# Patient Record
Sex: Male | Born: 1960 | Race: White | Hispanic: No | Marital: Single | Smoking: Former smoker
Health system: Southern US, Community
[De-identification: ages and names within clinical notes are randomized; demographics above are authoritative.]

## PROBLEM LIST (undated history)

## (undated) DIAGNOSIS — J189 Pneumonia, unspecified organism: Secondary | ICD-10-CM

## (undated) DIAGNOSIS — F1010 Alcohol abuse, uncomplicated: Secondary | ICD-10-CM

## (undated) DIAGNOSIS — M1990 Unspecified osteoarthritis, unspecified site: Secondary | ICD-10-CM

## (undated) DIAGNOSIS — K7460 Unspecified cirrhosis of liver: Secondary | ICD-10-CM

## (undated) HISTORY — DX: Unspecified cirrhosis of liver: K74.60

## (undated) HISTORY — PX: APPENDECTOMY: SHX54

---

## 2003-10-14 ENCOUNTER — Ambulatory Visit (HOSPITAL_COMMUNITY): Admission: RE | Admit: 2003-10-14 | Discharge: 2003-10-14 | Payer: Self-pay | Admitting: Family Medicine

## 2004-01-14 ENCOUNTER — Emergency Department (HOSPITAL_COMMUNITY): Admission: EM | Admit: 2004-01-14 | Discharge: 2004-01-14 | Payer: Self-pay | Admitting: Emergency Medicine

## 2006-03-02 ENCOUNTER — Ambulatory Visit (HOSPITAL_COMMUNITY): Admission: RE | Admit: 2006-03-02 | Discharge: 2006-03-02 | Payer: Self-pay | Admitting: Family Medicine

## 2011-02-09 ENCOUNTER — Emergency Department (HOSPITAL_COMMUNITY)
Admission: EM | Admit: 2011-02-09 | Discharge: 2011-02-10 | Disposition: A | Discharge status: Home / Self Care | Payer: Self-pay | Attending: Emergency Medicine | Admitting: Emergency Medicine

## 2011-02-09 ENCOUNTER — Emergency Department (HOSPITAL_COMMUNITY): Payer: Self-pay

## 2011-02-09 ENCOUNTER — Encounter: Payer: Self-pay | Admitting: Emergency Medicine

## 2011-02-09 DIAGNOSIS — W230XXA Caught, crushed, jammed, or pinched between moving objects, initial encounter: Secondary | ICD-10-CM | POA: Insufficient documentation

## 2011-02-09 DIAGNOSIS — M79609 Pain in unspecified limb: Secondary | ICD-10-CM | POA: Insufficient documentation

## 2011-02-09 DIAGNOSIS — T148XXA Other injury of unspecified body region, initial encounter: Secondary | ICD-10-CM

## 2011-02-09 DIAGNOSIS — M7989 Other specified soft tissue disorders: Secondary | ICD-10-CM | POA: Insufficient documentation

## 2011-02-09 DIAGNOSIS — R609 Edema, unspecified: Secondary | ICD-10-CM | POA: Insufficient documentation

## 2011-02-09 DIAGNOSIS — S8010XA Contusion of unspecified lower leg, initial encounter: Secondary | ICD-10-CM | POA: Insufficient documentation

## 2011-02-09 DIAGNOSIS — S8012XA Contusion of left lower leg, initial encounter: Secondary | ICD-10-CM

## 2011-02-09 MED ORDER — OXYCODONE-ACETAMINOPHEN 5-325 MG PO TABS
2.0000 | ORAL_TABLET | Freq: Once | ORAL | Status: DC
  Filled 2011-02-09: qty 2

## 2011-02-09 NOTE — ED Notes (Signed)
Pt. Refused medication. States it does not work for him.

## 2011-02-09 NOTE — ED Notes (Signed)
Patient was chopping wood and a piece of wood rolled and pinned him to trailer.  Left leg swollen and edematous.

## 2011-02-10 ENCOUNTER — Inpatient Hospital Stay (HOSPITAL_COMMUNITY): Admit: 2011-02-10 | Payer: Self-pay

## 2011-02-10 LAB — BASIC METABOLIC PANEL
BUN: 10 mg/dL (ref 6–23)
CO2: 24 mEq/L (ref 19–32)
Calcium: 9.2 mg/dL (ref 8.4–10.5)
Chloride: 99 mEq/L (ref 96–112)
Creatinine, Ser: 1.03 mg/dL (ref 0.50–1.35)
GFR calc Af Amer: >90 mL/min (ref >90)
GFR calc non Af Amer: 82 mL/min — ABNORMAL LOW (ref >90)
Glucose, Bld: 97 mg/dL (ref 70–99)
Potassium: 3.8 mEq/L (ref 3.5–5.1)
Sodium: 137 mEq/L (ref 135–145)

## 2011-02-10 LAB — CBC
HCT: 45.3 % (ref 39.0–52.0)
Hemoglobin: 15.5 g/dL (ref 13.0–17.0)
MCH: 32.8 pg (ref 26.0–34.0)
MCHC: 34.2 g/dL (ref 30.0–36.0)
MCV: 95.8 fL (ref 78.0–100.0)
Platelets: 256 10*3/uL (ref 150–400)
RBC: 4.73 MIL/uL (ref 4.22–5.81)
RDW: 13.4 % (ref 11.5–15.5)
WBC: 5.8 10*3/uL (ref 4.0–10.5)

## 2011-02-10 LAB — ETHANOL: Alcohol, Ethyl (B): 255 mg/dL — ABNORMAL HIGH (ref 0–11)

## 2011-02-10 NOTE — ED Provider Notes (Signed)
History     CSN: [id number]  Arrival date & time 02/09/11  2234   First MD Initiated Contact with Patient 02/09/11 2308      Chief Complaint  Patient presents with  . Leg Injury    (Consider location/radiation/quality/duration/timing/severity/associated sxs/prior treatment) HPI The patient presents with a left leg injury that occurred approximately one week ago.  He reports his left leg was crushed between the ground and a large log.  He's been walking on his leg since then and presents today complaining of ongoing pain in his left lower leg.  He reports the bruising is improving however he does have a large swelling in his left proximal medial aspect of his tibia.  He denies fever or chills.  He denies spreading erythema or warmth.  He denies swelling of his left thigh as compared to his right thigh.  He does report that there is swelling in his left lower extremity.  Pain is worsened by movement and palpation.  Is improved by nothing.  He has not been elevating his leg nor applying ice.  History reviewed. No pertinent past medical history.  History reviewed. No pertinent past surgical history.  No family history on file.  History  Substance Use Topics  . Smoking status: Former Games developer  . Smokeless tobacco: Not on file  . Alcohol Use: Yes     daily      Review of Systems  All other systems reviewed and are negative.    Allergies  Review of patient's allergies indicates no known allergies.  Home Medications  No current outpatient prescriptions on file.  BP 146/87  Pulse 99  Temp(Src) 97.7 F (36.5 C) (Oral)  Resp 20  Ht 5\' 8"  (1.727 m)  Wt 240 lb (108.863 kg)  BMI 36.49 kg/m2  SpO2 90%  Physical Exam  Constitutional: He is oriented to person, place, and time. He appears well-developed and well-nourished.       Smells of EtOH  HENT:  Head: Normocephalic.  Eyes: EOM are normal.  Neck: Normal range of motion.  Pulmonary/Chest: Effort normal.    Musculoskeletal:       Left lower extremity with mild edema and dependent ecchymosis in his left posterior calf.  He has a normal left DP and PT pulse.  His normal range of motion in his left ankle.  He has normal range of motion about his left knee.  There is no warmth of his left leg.  His compartments are soft to palpation.  His thighs are symmetric bilaterally.  There does appear to be a large hematoma of his left proximal medial aspect of his lower leg.  With tenderness and no fluctuance  Neurological: He is alert and oriented to person, place, and time.  Psychiatric: He has a normal mood and affect.    ED Course  Procedures (including critical care time)  Labs Reviewed  BASIC METABOLIC PANEL - Abnormal; Notable for the following:    GFR calc non Af Amer 82 (*)    All other components within normal limits  ETHANOL - Abnormal; Notable for the following:    Alcohol, Ethyl (B) 255 (*)    All other components within normal limits  CBC   Dg Tibia/fibula Left  02/10/2011  *RADIOLOGY REPORT*  Clinical Data: Left mid tibia / fibula pain and swelling, after blunt trauma.  LEFT TIBIA AND FIBULA - 2 VIEW  Comparison: None.  Findings: There is no evidence of fracture or dislocation.  The tibia and fibula  appear intact.  A well-corticated osseous fragment arising at the posterior malleolus may reflect remote injury.  Mild degenerative change is noted about the ankle mortise.  A plantar calcaneal spur is incidentally seen.  No significant soft tissue abnormalities are characterized on radiograph.  IMPRESSION:  1.  No evidence of fracture or dislocation. 2.  Well-corticated osseous fragment at the posterior malleolus may reflect remote injury.  Original Report Authenticated By: Tonia Ghent, M.D.   I personally evaluated the x-ray.   1. Contusion of left lower leg   2. Hematoma       MDM  The patient's compartments are soft.  His plain films are negative.  My suspicion that there is infection  is very low.  I suspect this is a large hematoma that is causing ongoing pain.  The patient is continued to be extremely active on his left lower extremity and working daily and I suspect that is exacerbating his symptoms.  The patient has no left thigh swelling to suggest DVT however given his trauma and a swelling in his left lower extremity was reasonable to obtain.  There is no ability to night to obtain an ultrasound also will schedule the patient for an ultrasound in the morning.  Typically I did give the patient Lovenox however given my suspicion that this is hematoma more to avoid Lovenox at this point unless there is clear-cut evidence of DVT on [place] in the AM.  He denies chest pain shortness of breath at this time.  His pulse ox is 99% on room air he does not have the PE clinically.  The patient does have a primary care physician for a mild recommended followup.  I recommended ice and elevation and close followup.  His compartments are soft and there is no signs of compartment syndrome at this time.  He has pulses and normal temperature of the skin.  His note pain with passive range of motion.  He has no paresthesias  Pt was offered percocet for pain. He reports he doesn't want it      Lyanne Co, MD 02/10/11 [phone]

## 2015-09-11 ENCOUNTER — Encounter (HOSPITAL_COMMUNITY): Payer: Self-pay

## 2015-09-11 ENCOUNTER — Emergency Department (HOSPITAL_COMMUNITY)
Admission: EM | Admit: 2015-09-11 | Discharge: 2015-09-12 | Disposition: A | Discharge status: Home / Self Care | Payer: BLUE CROSS/BLUE SHIELD | Attending: Emergency Medicine | Admitting: Emergency Medicine

## 2015-09-11 DIAGNOSIS — M62838 Other muscle spasm: Secondary | ICD-10-CM

## 2015-09-11 DIAGNOSIS — F0781 Postconcussional syndrome: Secondary | ICD-10-CM

## 2015-09-11 DIAGNOSIS — Z87891 Personal history of nicotine dependence: Secondary | ICD-10-CM | POA: Insufficient documentation

## 2015-09-11 DIAGNOSIS — R51 Headache: Secondary | ICD-10-CM | POA: Diagnosis present

## 2015-09-11 HISTORY — DX: Alcohol abuse, uncomplicated: F10.10

## 2015-09-11 NOTE — ED Triage Notes (Signed)
C/o left sided head , neck and left shoulder pain. Patient states pain X1 week. Denies recent injury. Patient also states he has been having dizzy/syncopal episodes with head movement.

## 2015-09-11 NOTE — ED Notes (Signed)
Patient states he drinks alcohol daily.

## 2015-09-12 MED ORDER — IBUPROFEN 800 MG PO TABS: 800.0000 mg | ORAL_TABLET | Freq: Three times a day (TID) | ORAL | 0 refills | Status: DC | PRN

## 2015-09-12 MED ORDER — KETOROLAC TROMETHAMINE 30 MG/ML IJ SOLN
60.0000 mg | Freq: Once | INTRAMUSCULAR | Status: AC
  Administered 2015-09-12: 60 mg via INTRAMUSCULAR
  Filled 2015-09-12: qty 2

## 2015-09-12 MED ORDER — HYDROMORPHONE HCL 1 MG/ML IJ SOLN
1.0000 mg | Freq: Once | INTRAMUSCULAR | Status: AC
  Administered 2015-09-12: 1 mg via INTRAMUSCULAR
  Filled 2015-09-12: qty 1

## 2015-09-12 MED ORDER — METHOCARBAMOL 500 MG PO TABS: 500.0000 mg | ORAL_TABLET | Freq: Three times a day (TID) | ORAL | 0 refills | Status: DC | PRN

## 2015-09-12 MED ORDER — HYDROCODONE-ACETAMINOPHEN 5-325 MG PO TABS: 1.0000 | ORAL_TABLET | Freq: Four times a day (QID) | ORAL | 0 refills | Status: DC | PRN

## 2015-09-12 NOTE — ED Provider Notes (Signed)
TIME SEEN: 12:14 AM  CHIEF COMPLAINT: Left Shoulder Pain  HPI: HPI Comments: Billy Chung is a 55 y.o. male who presents to the Emergency Department complaining of intermittent, worsening, severe, cramping, left shoulder pain onset one week. Pt notes pain radiation into his left trapezius, left neck, and the left side of his head. He endorses pain exacerbation with palpation to these areas, most significantly his left trapezius muscle. Pt states he lifts heavy objects daily but denies known injury to his left arm, neck or head. Pt endorses some pain alleviation with "popping out" his left arm. He has tried various OTC pain medications with minimal relief. Pt notes some intermittent SOB at baseline denies any new chest pain, shortness of breath. He also reports intermittent dizziness since a concussion several years ago; he denies new head injury. Pt has no known allergies to medications. He denies chest pain, nausea, vomiting, numbness or focal weakness, bowel/bladder incontinence, dysuria, or any other associated symptoms.  PCP: Dr. Sudie Bailey   ROS: See HPI Constitutional: no fever  Eyes: no drainage  ENT: no runny nose   Cardiovascular:  no chest pain  Resp: SOB (intermittent, baseline) GI: no vomiting GU: no dysuria Integumentary: no rash  Allergy: no hives  Musculoskeletal: no leg swelling  Neurological: no slurred speech ROS otherwise negative  PAST MEDICAL HISTORY/PAST SURGICAL HISTORY:  Past Medical History:  Diagnosis Date  . Alcohol abuse     MEDICATIONS:  Prior to Admission medications   Not on File    ALLERGIES:  No Known Allergies  SOCIAL HISTORY:  Social History  Substance Use Topics  . Smoking status: Former Games developer  . Smokeless tobacco: Current User    Types: Chew  . Alcohol use Yes     Comment: daily    FAMILY HISTORY: History reviewed. No pertinent family history.  EXAM: BP 141/81 (BP Location: Right Arm)   Pulse 91   Temp 98 F (36.7 C)  (Oral)   Resp 20   Ht 5\' 8"  (1.727 m)   Wt 280 lb (127 kg)   SpO2 92%   BMI 42.57 kg/m  CONSTITUTIONAL: Alert and oriented and responds appropriately to questions. Well-appearing; well-nourished, obese, in no distress, laughing HEAD: Normocephalic EYES: Conjunctivae clear, PERRL ENT: normal nose; no rhinorrhea; moist mucous membranes NECK: Supple, no meningismus, no LAD, no midline spinal tenderness, step off, or deformity; TTP over the left trapezius muscle which reproduces pain and associated muscle spasm CARD: RRR; S1 and S2 appreciated; no murmurs, no clicks, no rubs, no gallops RESP: Normal chest excursion without splinting or tachypnea; breath sounds clear and equal bilaterally; no wheezes, no rhonchi, no rales, no hypoxia or respiratory distress, speaking full sentences ABD/GI: Normal bowel sounds; non-distended; soft, non-tender, no rebound, no guarding, no peritoneal signs BACK:  The back appears normal and is non-tender to palpation, there is no CVA tenderness EXT: No bony tenderness of left shoulder; no loss of fullness of left shoulder; 2+ radial pulse left arm; compartments are soft; no joint effusion; normal ROM in all joints; non-tender to palpation; no edema; normal capillary refill; no cyanosis, no calf tenderness or swelling    SKIN: Normal color for age and race; warm; no rash NEURO: Moves all extremities equally, sensation to light touch intact diffusely, cranial nerves II through XII intact; 5/5 strength in all four extremities, normal gait PSYCH: The patient's mood and manner are appropriate. Grooming and personal hygiene are appropriate.  MEDICAL DECISION MAKING: Patient here with left trapezius muscle  spasm. Has no midline spinal tenderness and no bony tenderness of the left shoulder on exam. No history of injury to suggest he needs x-rays to rule out fracture. He is neurologically intact distally and has strong pulses in his left arm. No sign of cellulitis, gout, septic  arthritis. Doubt meningitis. Discussed with patient I suspect muscle spasm, less likely rotator cuff tendinitis. We'll discharge with prescriptions for Vicodin, Robaxin, ibuprofen. We'll have him follow-up with his PCP.  He does mention having a head injury several years ago and has had intermittent dizziness since. No new neurologic deficits. No new head injury. Not on anticoagulation. He does discuss that he frequently has to climb ladders. Discussed with patient I do not feel this is safe as he is having dizziness. Have offered him a work note which he declines. Have recommended he follow-up with a neurologist. Have given him outpatient follow-up information. Patient and wife at bedside agree with this plan.   At this time, I do not feel there is any life-threatening condition present. I have reviewed and discussed all results (EKG, imaging, lab, urine as appropriate), exam findings with patient. I have reviewed nursing notes and appropriate previous records.  I feel the patient is safe to be discharged home without further emergent workup. Discussed usual and customary return precautions. Patient and family (if present) verbalize understanding and are comfortable with this plan.  Patient will follow-up with their primary care provider. If they do not have a primary care provider, information for follow-up has been provided to them. All questions have been answered.      EKG Interpretation  Date/Time:  Saturday September 12 2015 00:04:20 EDT Ventricular Rate:  90 PR Interval:    QRS Duration: 106 QT Interval:  352 QTC Calculation: 431 R Axis:   70 Text Interpretation:  Sinus rhythm Nonspecific T abnormalities, lateral leads limb leads no longer reversed Confirmed by Carletha Dawn,  DO, Suzane Vanderweide ([id number]) on 09/12/2015 12:08:48 AM       I personally performed the services described in this documentation, which was scribed in my presence. The recorded information has been reviewed and is accurate.     Layla Maw Angelgabriel Willmore, DO 09/12/15 [phone]

## 2015-11-01 ENCOUNTER — Encounter (HOSPITAL_COMMUNITY): Payer: Self-pay | Admitting: Emergency Medicine

## 2015-11-01 ENCOUNTER — Emergency Department (HOSPITAL_COMMUNITY): Payer: BLUE CROSS/BLUE SHIELD

## 2015-11-01 ENCOUNTER — Emergency Department (HOSPITAL_COMMUNITY)
Admission: EM | Admit: 2015-11-01 | Discharge: 2015-11-01 | Disposition: A | Discharge status: Home / Self Care | Payer: BLUE CROSS/BLUE SHIELD | Attending: Emergency Medicine | Admitting: Emergency Medicine

## 2015-11-01 DIAGNOSIS — S82851A Displaced trimalleolar fracture of right lower leg, initial encounter for closed fracture: Secondary | ICD-10-CM | POA: Diagnosis not present

## 2015-11-01 DIAGNOSIS — Y9389 Activity, other specified: Secondary | ICD-10-CM | POA: Insufficient documentation

## 2015-11-01 DIAGNOSIS — Y999 Unspecified external cause status: Secondary | ICD-10-CM | POA: Diagnosis not present

## 2015-11-01 DIAGNOSIS — Z87891 Personal history of nicotine dependence: Secondary | ICD-10-CM | POA: Insufficient documentation

## 2015-11-01 DIAGNOSIS — S99911A Unspecified injury of right ankle, initial encounter: Secondary | ICD-10-CM | POA: Diagnosis present

## 2015-11-01 DIAGNOSIS — W231XXA Caught, crushed, jammed, or pinched between stationary objects, initial encounter: Secondary | ICD-10-CM | POA: Diagnosis not present

## 2015-11-01 DIAGNOSIS — Y929 Unspecified place or not applicable: Secondary | ICD-10-CM | POA: Insufficient documentation

## 2015-11-01 DIAGNOSIS — Z791 Long term (current) use of non-steroidal anti-inflammatories (NSAID): Secondary | ICD-10-CM | POA: Diagnosis not present

## 2015-11-01 DIAGNOSIS — Z79899 Other long term (current) drug therapy: Secondary | ICD-10-CM | POA: Diagnosis not present

## 2015-11-01 MED ORDER — OXYCODONE-ACETAMINOPHEN 5-325 MG PO TABS: 1.0000 | ORAL_TABLET | Freq: Four times a day (QID) | ORAL | 0 refills | Status: DC | PRN

## 2015-11-01 MED ORDER — BACITRACIN ZINC 500 UNIT/GM EX OINT
TOPICAL_OINTMENT | CUTANEOUS | Status: AC
  Administered 2015-11-01: 22:00:00
  Filled 2015-11-01: qty 0.9

## 2015-11-01 MED ORDER — OXYCODONE-ACETAMINOPHEN 5-325 MG PO TABS: 1.0000 | ORAL_TABLET | Freq: Four times a day (QID) | ORAL | Status: DC | PRN

## 2015-11-01 MED ORDER — PROPOFOL 10 MG/ML IV BOLUS
INTRAVENOUS | Status: AC | PRN
  Administered 2015-11-01 (×4): 20 mg via INTRAVENOUS
  Administered 2015-11-01: 40 mg via INTRAVENOUS
  Administered 2015-11-01 (×6): 20 mg via INTRAVENOUS

## 2015-11-01 MED ORDER — PROPOFOL 10 MG/ML IV BOLUS
0.5000 mg/kg | Freq: Once | INTRAVENOUS | Status: DC
  Filled 2015-11-01: qty 20

## 2015-11-01 NOTE — ED Provider Notes (Signed)
[facility]-EMERGENCY DEPT Provider Note   CSN: [id number] Arrival date & time: 11/01/15  1900     History   Chief Complaint Chief Complaint  Patient presents with  . Leg Injury    HPI Billy Chung is a 55 y.o. male.  Patient is a 55 year old male with no significant past medical history. He presents for evaluation of a right ankle injury. He reports accidentally lowering the tractor onto his leg. His friends were able to lift it and get his ankle out from under it. He has deformity, pain, and swelling to the distal ankle. He denies any other injury. He denies any numbness or tingling.      Past Medical History:  Diagnosis Date  . Alcohol abuse     There are no active problems to display for this patient.   Past Surgical History:  Procedure Laterality Date  . APPENDECTOMY         Home Medications    Prior to Admission medications   Medication Sig Start Date End Date Taking? Authorizing Provider  HYDROcodone-acetaminophen (NORCO/VICODIN) 5-325 MG tablet Take 1-2 tablets by mouth every 6 (six) hours as needed. 09/12/15   Kristen N Ward, DO  ibuprofen (ADVIL,MOTRIN) 800 MG tablet Take 1 tablet (800 mg total) by mouth every 8 (eight) hours as needed for mild pain. 09/12/15   Kristen N Ward, DO  methocarbamol (ROBAXIN) 500 MG tablet Take 1 tablet (500 mg total) by mouth every 8 (eight) hours as needed for muscle spasms. 09/12/15   Layla MawKristen N Ward, DO    Family History History reviewed. No pertinent family history.  Social History Social History  Substance Use Topics  . Smoking status: Former Games developermoker  . Smokeless tobacco: Current User    Types: Chew  . Alcohol use Yes     Comment: daily     Allergies   Review of patient's allergies indicates no known allergies.   Review of Systems Review of Systems  All other systems reviewed and are negative.    Physical Exam Updated Vital Signs BP 120/80 (BP Location: Left Arm)   Pulse 90   Temp 98.4 F (36.9 C)  (Oral)   Resp 20   Ht 5\' 8"  (1.727 m)   Wt 280 lb (127 kg)   SpO2 92%   BMI 42.57 kg/m   Physical Exam  Constitutional: He is oriented to person, place, and time. He appears well-developed and well-nourished. No distress.  The outer of alcohol is present  HENT:  Head: Normocephalic and atraumatic.  Neck: Normal range of motion. Neck supple.  Musculoskeletal:  The right ankle appears to be externally rotated. There is an abrasion to the medial malleolus, however no laceration. DP pulses are easily palpable. Motor and sensation are intact to all toes.  Neurological: He is alert and oriented to person, place, and time.  Skin: Skin is warm and dry. He is not diaphoretic.  Nursing note and vitals reviewed.    ED Treatments / Results  Labs (all labs ordered are listed, but only abnormal results are displayed) Labs Reviewed - No data to display  EKG  EKG Interpretation None       Radiology No results found.  Procedures .Sedation Date/Time: 11/01/2015 11:48 PM Performed by: Geoffery LyonsELO, Gwyndolyn Guilford Authorized by: Geoffery LyonsELO, Garald Rhew   Consent:    Consent obtained:  Written   Consent given by:  Patient   Risks discussed:  Dysrhythmia, inadequate sedation, prolonged hypoxia resulting in organ damage, prolonged sedation necessitating reversal, nausea, vomiting  and respiratory compromise necessitating ventilatory assistance and intubation Indications:    Procedure performed:  Fracture reduction   Procedure necessitating sedation performed by:  Physician performing sedation   Intended level of sedation:  Moderate (conscious sedation) Pre-sedation assessment:    ASA classification: class 1 - normal, healthy patient     Neck mobility: normal     Pre-sedation assessments completed and reviewed: airway patency, cardiovascular function and mental status     History of difficult intubation: no   Immediate pre-procedure details:    Reviewed: vital signs     Verified: bag valve mask available,  intubation equipment available and oxygen available   Procedure details (see MAR for exact dosages):    Sedation start time:  11/01/2015 11:51 PM   Preoxygenation:  Nasal cannula   Sedation:  Propofol   Intra-procedure monitoring:  Blood pressure monitoring, continuous capnometry, frequent LOC assessments, frequent vital sign checks, continuous pulse oximetry and cardiac monitor   Intra-procedure events: none   Post-procedure details:    Post-sedation assessments completed and reviewed: airway patency, cardiovascular function, mental status and respiratory function     Patient is stable for discharge or admission: yes     Patient tolerance:  Tolerated well, no immediate complications   (including critical care time)  Medications Ordered in ED Medications - No data to display   Initial Impression / Assessment and Plan / ED Course  I have reviewed the triage vital signs and the nursing notes.  Pertinent labs & imaging results that were available during my care of the patient were reviewed by me and considered in my medical decision making (see chart for details).  Clinical Course    X-rays reveal a displaced trimalleolar fracture/dislocation. There is an abrasion to the anterior aspect of the ankle, however this is not a laceration and not indicative of an open fracture.  I've discussed this fracture with Dr. Lajoyce Corners from orthopedics who is recommending reduction, splinting, and follow-up tomorrow in his office.  Conscious sedation was performed and the fracture was reduced successfully and splinted in a posterior splint with stirrup. Post reduction films show near anatomic alignment.  He will be discharged with pain medication and advised to follow-up tomorrow with Dr. Lajoyce Corners.   Final Clinical Impressions(s) / ED Diagnoses   Final diagnoses:  None    New Prescriptions New Prescriptions   No medications on file     Geoffery Lyons, MD 11/01/15 2352

## 2015-11-01 NOTE — ED Notes (Signed)
Ice pack applied.

## 2015-11-01 NOTE — ED Notes (Signed)
Pt alert & oriented x4, stable gait. Patient given discharge instructions, paperwork & prescription(s). Patient informed not to drive, operate any equipment & handel any important documents 4 hours after taking pain medication. Patient  instructed to stop at the registration desk to finish any additional paperwork. Patient  verbalized understanding. Pt left department w/ no further questions. 

## 2015-11-01 NOTE — ED Notes (Addendum)
Pt states had a tractor fall in his right ankle. Noted bruising & swelling. Foot is rotated outward. Pulses present, cap refill is less than 2 seconds. Pt has sensation in toes. Pt admits to drinking 12 beers today.

## 2015-11-01 NOTE — ED Triage Notes (Signed)
PT stated he was working on a tractor and the jack gave away and the tractor tire still attached to the tractor came down onto his RLE pinning him between the tractor and the concrete floor. PT stated his friends were able to lift the tire some to get it off the leg to get free. PT arrived to ED in the back of a pickup truck. PT also states he has been drinking beer heavily today. Good cap-refill noted to right foot. Obvious external rotation noted to right ankle.

## 2015-11-01 NOTE — Discharge Instructions (Signed)
Wear splint and no weightbearing until approved by Dr. Lajoyce Cornersuda.  Keep your leg elevated and iced for 30 minutes of every 2 hours until further instructions from Dr. Lajoyce Cornersuda.  Percocet as prescribed as needed for pain.  You are to see Dr. Lajoyce Cornersuda tomorrow in his office. His contact information has been provided in this discharge summary for you to call and make these arrangements.

## 2015-11-03 ENCOUNTER — Encounter (HOSPITAL_COMMUNITY): Payer: Self-pay | Admitting: *Deleted

## 2015-11-03 ENCOUNTER — Other Ambulatory Visit (HOSPITAL_COMMUNITY): Payer: Self-pay | Admitting: Family

## 2015-11-03 MED FILL — Oxycodone w/ Acetaminophen Tab 5-325 MG: ORAL | Qty: 6 | Status: AC

## 2015-11-03 NOTE — Progress Notes (Signed)
Spoke with pt for pre-op call. Pt denies cardiac history, chest pain or sob. Pt does admit to drinking 24 - 12 oz cans of beer a day.

## 2015-11-03 NOTE — Progress Notes (Deleted)
Billy Chung denies chest pain, she said she had it several years ago.   Patient reports having an EKG at Dr Robyn Saunders office within the year.  I requested EKG Patient has sleep apnea, treated with oxygen at bedtime. 

## 2015-11-04 ENCOUNTER — Encounter (HOSPITAL_COMMUNITY): Payer: Self-pay | Admitting: Surgery

## 2015-11-04 ENCOUNTER — Ambulatory Visit (HOSPITAL_COMMUNITY): Payer: BLUE CROSS/BLUE SHIELD | Admitting: Certified Registered Nurse Anesthetist

## 2015-11-04 ENCOUNTER — Ambulatory Visit (HOSPITAL_COMMUNITY)
Admission: RE | Admit: 2015-11-04 | Discharge: 2015-11-04 | Disposition: A | Discharge status: Home / Self Care | Payer: BLUE CROSS/BLUE SHIELD | Source: Ambulatory Visit | Attending: Orthopedic Surgery | Admitting: Orthopedic Surgery

## 2015-11-04 ENCOUNTER — Encounter (HOSPITAL_COMMUNITY)
Admission: RE | Disposition: A | Discharge status: Home / Self Care | Payer: Self-pay | Source: Ambulatory Visit | Attending: Orthopedic Surgery

## 2015-11-04 DIAGNOSIS — S82891A Other fracture of right lower leg, initial encounter for closed fracture: Secondary | ICD-10-CM

## 2015-11-04 DIAGNOSIS — Z87891 Personal history of nicotine dependence: Secondary | ICD-10-CM | POA: Insufficient documentation

## 2015-11-04 DIAGNOSIS — W3089XA Contact with other specified agricultural machinery, initial encounter: Secondary | ICD-10-CM | POA: Diagnosis not present

## 2015-11-04 DIAGNOSIS — Z6841 Body Mass Index (BMI) 40.0 and over, adult: Secondary | ICD-10-CM | POA: Diagnosis not present

## 2015-11-04 DIAGNOSIS — S93431A Sprain of tibiofibular ligament of right ankle, initial encounter: Secondary | ICD-10-CM | POA: Insufficient documentation

## 2015-11-04 DIAGNOSIS — S82851A Displaced trimalleolar fracture of right lower leg, initial encounter for closed fracture: Secondary | ICD-10-CM | POA: Insufficient documentation

## 2015-11-04 HISTORY — DX: Pneumonia, unspecified organism: J18.9

## 2015-11-04 HISTORY — PX: ORIF ANKLE FRACTURE: SHX5408

## 2015-11-04 LAB — CBC
HCT: 44.7 % (ref 39.0–52.0)
HEMOGLOBIN: 14.7 g/dL (ref 13.0–17.0)
MCH: 32.3 pg (ref 26.0–34.0)
MCHC: 32.9 g/dL (ref 30.0–36.0)
MCV: 98.2 fL (ref 78.0–100.0)
PLATELETS: 234 10*3/uL (ref 150–400)
RBC: 4.55 MIL/uL (ref 4.22–5.81)
RDW: 13.4 % (ref 11.5–15.5)
WBC: 7.4 10*3/uL (ref 4.0–10.5)

## 2015-11-04 LAB — COMPREHENSIVE METABOLIC PANEL
ALBUMIN: 3.4 g/dL — AB (ref 3.5–5.0)
ALK PHOS: 75 U/L (ref 38–126)
ALT: 41 U/L (ref 17–63)
AST: 38 U/L (ref 15–41)
Anion gap: 8 (ref 5–15)
BUN: 10 mg/dL (ref 6–20)
CHLORIDE: 106 mmol/L (ref 101–111)
CO2: 24 mmol/L (ref 22–32)
CREATININE: 0.96 mg/dL (ref 0.61–1.24)
Calcium: 8.8 mg/dL — ABNORMAL LOW (ref 8.9–10.3)
GFR calc non Af Amer: >60 mL/min (ref >60)
GLUCOSE: 104 mg/dL — AB (ref 65–99)
Potassium: 4.5 mmol/L (ref 3.5–5.1)
SODIUM: 138 mmol/L (ref 135–145)
Total Bilirubin: 1.3 mg/dL — ABNORMAL HIGH (ref 0.3–1.2)
Total Protein: 6.7 g/dL (ref 6.5–8.1)

## 2015-11-04 SURGERY — OPEN REDUCTION INTERNAL FIXATION (ORIF) ANKLE FRACTURE
Anesthesia: Regional | Laterality: Right

## 2015-11-04 MED ORDER — ALBUTEROL SULFATE HFA 108 (90 BASE) MCG/ACT IN AERS
INHALATION_SPRAY | RESPIRATORY_TRACT | Status: AC
  Filled 2015-11-04: qty 6.7

## 2015-11-04 MED ORDER — 0.9 % SODIUM CHLORIDE (POUR BTL) OPTIME
TOPICAL | Status: DC | PRN
  Administered 2015-11-04: 1000 mL

## 2015-11-04 MED ORDER — GLYCOPYRROLATE 0.2 MG/ML IJ SOLN
INTRAMUSCULAR | Status: DC | PRN
Start: 2015-11-04 — End: 2015-11-04
  Administered 2015-11-04 (×2): 0.2 mg via INTRAVENOUS

## 2015-11-04 MED ORDER — MIDAZOLAM HCL 2 MG/2ML IJ SOLN
2.0000 mg | Freq: Once | INTRAMUSCULAR | Status: AC
  Administered 2015-11-04: 2 mg via INTRAVENOUS

## 2015-11-04 MED ORDER — METOPROLOL TARTARATE 1 MG/ML SYRINGE (5ML)
Status: DC | PRN
  Administered 2015-11-04: 2 mg via INTRAVENOUS
  Administered 2015-11-04: 1 mg via INTRAVENOUS
  Administered 2015-11-04 (×2): 2.5 mg via INTRAVENOUS
  Administered 2015-11-04: 2 mg via INTRAVENOUS

## 2015-11-04 MED ORDER — CEFAZOLIN SODIUM 1 G IJ SOLR
INTRAMUSCULAR | Status: DC | PRN
  Administered 2015-11-04: 1 g via INTRAMUSCULAR

## 2015-11-04 MED ORDER — CEFAZOLIN SODIUM 1 G IJ SOLR
INTRAMUSCULAR | Status: AC
  Filled 2015-11-04: qty 10

## 2015-11-04 MED ORDER — FENTANYL CITRATE (PF) 100 MCG/2ML IJ SOLN
INTRAMUSCULAR | Status: AC
  Administered 2015-11-04: 50 ug
  Filled 2015-11-04: qty 2

## 2015-11-04 MED ORDER — OXYCODONE-ACETAMINOPHEN 5-325 MG PO TABS: 1.0000 | ORAL_TABLET | ORAL | 0 refills | Status: DC | PRN

## 2015-11-04 MED ORDER — CHLORHEXIDINE GLUCONATE 4 % EX LIQD: 60.0000 mL | Freq: Once | CUTANEOUS | Status: DC

## 2015-11-04 MED ORDER — ONDANSETRON HCL 4 MG/2ML IJ SOLN
INTRAMUSCULAR | Status: AC
  Filled 2015-11-04: qty 2

## 2015-11-04 MED ORDER — SUCCINYLCHOLINE CHLORIDE 20 MG/ML IJ SOLN
INTRAMUSCULAR | Status: DC | PRN
  Administered 2015-11-04: 120 mg via INTRAVENOUS

## 2015-11-04 MED ORDER — KETAMINE HCL 10 MG/ML IJ SOLN
INTRAMUSCULAR | Status: DC | PRN
  Administered 2015-11-04: 50 mg via INTRAVENOUS

## 2015-11-04 MED ORDER — ONDANSETRON HCL 4 MG/2ML IJ SOLN
INTRAMUSCULAR | Status: DC | PRN
  Administered 2015-11-04: 4 mg via INTRAVENOUS

## 2015-11-04 MED ORDER — LIDOCAINE 2% (20 MG/ML) 5 ML SYRINGE
INTRAMUSCULAR | Status: AC
  Filled 2015-11-04: qty 5

## 2015-11-04 MED ORDER — LACTATED RINGERS IV SOLN
INTRAVENOUS | Status: DC
  Administered 2015-11-04 (×2): via INTRAVENOUS

## 2015-11-04 MED ORDER — FENTANYL CITRATE (PF) 100 MCG/2ML IJ SOLN
50.0000 ug | Freq: Once | INTRAMUSCULAR | Status: AC
  Administered 2015-11-04: 100 ug via INTRAVENOUS

## 2015-11-04 MED ORDER — MIDAZOLAM HCL 2 MG/2ML IJ SOLN
INTRAMUSCULAR | Status: AC
  Administered 2015-11-04: 2 mg via INTRAVENOUS
  Filled 2015-11-04: qty 2

## 2015-11-04 MED ORDER — METOPROLOL TARTRATE 5 MG/5ML IV SOLN
INTRAVENOUS | Status: AC
  Filled 2015-11-04: qty 5

## 2015-11-04 MED ORDER — MIDAZOLAM HCL 2 MG/2ML IJ SOLN
INTRAMUSCULAR | Status: DC | PRN
  Administered 2015-11-04 (×2): 1 mg via INTRAVENOUS

## 2015-11-04 MED ORDER — ARTIFICIAL TEARS OP OINT
TOPICAL_OINTMENT | OPHTHALMIC | Status: AC
  Filled 2015-11-04: qty 3.5

## 2015-11-04 MED ORDER — KETAMINE HCL-SODIUM CHLORIDE 100-0.9 MG/10ML-% IV SOSY
PREFILLED_SYRINGE | INTRAVENOUS | Status: AC
Start: 2015-11-04 — End: 2015-11-04
  Filled 2015-11-04: qty 10

## 2015-11-04 MED ORDER — GLYCOPYRROLATE 0.2 MG/ML IV SOSY
PREFILLED_SYRINGE | INTRAVENOUS | Status: AC
Start: 2015-11-04 — End: 2015-11-04
  Filled 2015-11-04: qty 3

## 2015-11-04 MED ORDER — FENTANYL CITRATE (PF) 100 MCG/2ML IJ SOLN
INTRAMUSCULAR | Status: AC
  Filled 2015-11-04: qty 2

## 2015-11-04 MED ORDER — BUPIVACAINE-EPINEPHRINE (PF) 0.5% -1:200000 IJ SOLN
INTRAMUSCULAR | Status: DC | PRN
  Administered 2015-11-04: 40 mL

## 2015-11-04 MED ORDER — MIDAZOLAM HCL 2 MG/2ML IJ SOLN
INTRAMUSCULAR | Status: AC
  Filled 2015-11-04: qty 2

## 2015-11-04 MED ORDER — PROPOFOL 10 MG/ML IV BOLUS
INTRAVENOUS | Status: DC | PRN
Start: 2015-11-04 — End: 2015-11-04
  Administered 2015-11-04: 200 mg via INTRAVENOUS
  Administered 2015-11-04 (×2): 100 mg via INTRAVENOUS

## 2015-11-04 MED ORDER — ALBUTEROL SULFATE HFA 108 (90 BASE) MCG/ACT IN AERS
INHALATION_SPRAY | RESPIRATORY_TRACT | Status: DC | PRN
  Administered 2015-11-04: 2 via RESPIRATORY_TRACT

## 2015-11-04 MED ORDER — PROPOFOL 10 MG/ML IV BOLUS
INTRAVENOUS | Status: AC
  Filled 2015-11-04: qty 20

## 2015-11-04 MED ORDER — CEFAZOLIN SODIUM-DEXTROSE 2-4 GM/100ML-% IV SOLN
2.0000 g | INTRAVENOUS | Status: AC
  Administered 2015-11-04: 2 g via INTRAVENOUS
  Filled 2015-11-04: qty 100

## 2015-11-04 MED ORDER — LIDOCAINE HCL (CARDIAC) 20 MG/ML IV SOLN
INTRAVENOUS | Status: DC | PRN
  Administered 2015-11-04: 5 mL via INTRATRACHEAL

## 2015-11-04 SURGICAL SUPPLY — 52 items
BANDAGE ESMARK 6X9 LF (GAUZE/BANDAGES/DRESSINGS) IMPLANT
BIT DRILL 2.5X110 QC LCP DISP (BIT) ×2 IMPLANT
BNDG CMPR 9X6 STRL LF SNTH (GAUZE/BANDAGES/DRESSINGS)
BNDG COHESIVE 4X5 TAN STRL (GAUZE/BANDAGES/DRESSINGS) ×3 IMPLANT
BNDG COHESIVE 6X5 TAN STRL LF (GAUZE/BANDAGES/DRESSINGS) ×2 IMPLANT
BNDG ESMARK 6X9 LF (GAUZE/BANDAGES/DRESSINGS)
BNDG GAUZE ELAST 4 BULKY (GAUZE/BANDAGES/DRESSINGS) ×3 IMPLANT
COVER SURGICAL LIGHT HANDLE (MISCELLANEOUS) ×6 IMPLANT
CUFF TOURNIQUET SINGLE 34IN LL (TOURNIQUET CUFF) IMPLANT
CUFF TOURNIQUET SINGLE 44IN (TOURNIQUET CUFF) IMPLANT
DRAPE INCISE IOBAN 66X45 STRL (DRAPES) ×3 IMPLANT
DRAPE OEC MINIVIEW 54X84 (DRAPES) IMPLANT
DRAPE PROXIMA HALF (DRAPES) ×3 IMPLANT
DRAPE U-SHAPE 47X51 STRL (DRAPES) ×3 IMPLANT
DRSG ADAPTIC 3X8 NADH LF (GAUZE/BANDAGES/DRESSINGS) ×3 IMPLANT
DRSG PAD ABDOMINAL 8X10 ST (GAUZE/BANDAGES/DRESSINGS) ×3 IMPLANT
DURAPREP 26ML APPLICATOR (WOUND CARE) ×3 IMPLANT
ELECT REM PT RETURN 9FT ADLT (ELECTROSURGICAL) ×3
ELECTRODE REM PT RTRN 9FT ADLT (ELECTROSURGICAL) ×1 IMPLANT
GAUZE SPONGE 4X4 12PLY STRL (GAUZE/BANDAGES/DRESSINGS) ×3 IMPLANT
GLOVE BIOGEL PI IND STRL 9 (GLOVE) ×1 IMPLANT
GLOVE BIOGEL PI INDICATOR 9 (GLOVE) ×2
GLOVE SURG ORTHO 9.0 STRL STRW (GLOVE) ×3 IMPLANT
GOWN STRL REUS W/ TWL XL LVL3 (GOWN DISPOSABLE) ×3 IMPLANT
GOWN STRL REUS W/TWL XL LVL3 (GOWN DISPOSABLE) ×9
KIT BASIN OR (CUSTOM PROCEDURE TRAY) ×3 IMPLANT
KIT ROOM TURNOVER OR (KITS) ×3 IMPLANT
MANIFOLD NEPTUNE II (INSTRUMENTS) ×3 IMPLANT
NS IRRIG 1000ML POUR BTL (IV SOLUTION) ×3 IMPLANT
PACK ORTHO EXTREMITY (CUSTOM PROCEDURE TRAY) ×3 IMPLANT
PAD ABD 8X10 STRL (GAUZE/BANDAGES/DRESSINGS) ×2 IMPLANT
PAD ARMBOARD 7.5X6 YLW CONV (MISCELLANEOUS) ×6 IMPLANT
PLATE LCP 3.5 1/3 TUB 8HX93 (Plate) ×2 IMPLANT
SCREW CORTEX 3.5 14MM (Screw) ×4 IMPLANT
SCREW CORTEX 3.5 16MM (Screw) ×4 IMPLANT
SCREW CORTEX 3.5 18MM (Screw) ×2 IMPLANT
SCREW CORTEX 3.5 50MM (Screw) ×2 IMPLANT
SCREW LOCK CORT ST 3.5X14 (Screw) IMPLANT
SCREW LOCK CORT ST 3.5X16 (Screw) IMPLANT
SCREW LOCK CORT ST 3.5X18 (Screw) IMPLANT
SPONGE GAUZE 4X4 12PLY STER LF (GAUZE/BANDAGES/DRESSINGS) ×2 IMPLANT
SPONGE LAP 18X18 X RAY DECT (DISPOSABLE) ×3 IMPLANT
STAPLER VISISTAT 35W (STAPLE) IMPLANT
SUCTION FRAZIER HANDLE 10FR (MISCELLANEOUS) ×2
SUCTION TUBE FRAZIER 10FR DISP (MISCELLANEOUS) ×1 IMPLANT
SUT ETHILON 2 0 PSLX (SUTURE) IMPLANT
SUT VIC AB 2-0 CT1 27 (SUTURE) ×6
SUT VIC AB 2-0 CT1 TAPERPNT 27 (SUTURE) ×2 IMPLANT
TOWEL OR 17X24 6PK STRL BLUE (TOWEL DISPOSABLE) ×3 IMPLANT
TOWEL OR 17X26 10 PK STRL BLUE (TOWEL DISPOSABLE) ×3 IMPLANT
TUBE CONNECTING 12'X1/4 (SUCTIONS) ×1
TUBE CONNECTING 12X1/4 (SUCTIONS) ×2 IMPLANT

## 2015-11-04 NOTE — Anesthesia Procedure Notes (Signed)
Procedure Name: Intubation Date/Time: 11/04/2015 11:52 AM Performed by: Cecile HearingURK, STEPHEN EDWARD Pre-anesthesia Checklist: Patient identified, Emergency Drugs available, Suction available and Patient being monitored Patient Re-evaluated:Patient Re-evaluated prior to inductionOxygen Delivery Method: Circle system utilized Preoxygenation: Pre-oxygenation with 100% oxygen Intubation Type: IV induction Ventilation: Oral airway inserted - appropriate to patient size LMA: LMA inserted LMA Size: 5.0 Laryngoscope Size: Mac and 3 Grade View: Grade II Tube type: Oral Tube size: 7.0 mm Number of attempts: 1 Placement Confirmation: ETT inserted through vocal cords under direct vision,  positive ETCO2 and breath sounds checked- equal and bilateral Secured at: 24 cm Tube secured with: Tape Dental Injury: Teeth and Oropharynx as per pre-operative assessment

## 2015-11-04 NOTE — Progress Notes (Signed)
Orthopedic Tech Progress Note Patient Details:  Crista LuriaRandall H Burstein 04/24/1960 [id number]  Ortho Devices Type of Ortho Device: CAM walker Ortho Device/Splint Location: rle Ortho Device/Splint Interventions: Application   Chris Narasimhan 11/04/2015, 2:03 PM

## 2015-11-04 NOTE — H&P (Signed)
Crista LuriaRandall H Gullickson is an 55 y.o. male.   Chief Complaint: Trimalleolar right ankle fracture HPI: Patient is a 55 year old gentleman states that he was working on a tractor there was up on a lift the lift collapsed and the tractor fell with the patient sustaining a right ankle fracture.  Past Medical History:  Diagnosis Date  . Alcohol abuse   . Pneumonia     Past Surgical History:  Procedure Laterality Date  . APPENDECTOMY      History reviewed. No pertinent family history. Social History:  reports that he has quit smoking. His smokeless tobacco use includes Chew. He reports that he drinks about 100.8 oz of alcohol per week . He reports that he uses drugs, including Cocaine.  Allergies: No Known Allergies  No prescriptions prior to admission.    No results found for this or any previous visit (from the past 48 hour(s)). No results found.  Review of Systems  All other systems reviewed and are negative.   There were no vitals taken for this visit. Physical Exam  On examination patient has a palpable dorsalis pedis pulse there is swelling there is no blistering there is no skin breakdown over the area of the fracture. Radiographs shows a trimalleolar right ankle fracture. Assessment/Plan Assessment: Trimalleolar right ankle fracture.  Plan: We will plan for open reduction internal fixation. Risk and benefits were discussed patient states he understands wishes to proceed at this time.  Nadara MustardMarcus V Duda, MD 11/04/2015, 6:37 AM

## 2015-11-04 NOTE — Anesthesia Procedure Notes (Signed)
Anesthesia Regional Block:  Popliteal block  Pre-Anesthetic Checklist: ,, timeout performed, Correct Patient, Correct Site, Correct Laterality, Correct Procedure, Correct Position, site marked, Risks and benefits discussed,  Surgical consent,  Pre-op evaluation,  At surgeon's request and post-op pain management  Laterality: Right  Prep: chloraprep       Needles:  Injection technique: Single-shot  Needle Type: Echogenic Needle     Needle Length: 9cm 9 cm Needle Gauge: 21 and 21 G    Additional Needles:  Procedures: ultrasound guided (picture in chart) Popliteal block Narrative:  Injection made incrementally with aspirations every 5 mL.  Performed by: Personally  Anesthesiologist: TURK, STEPHEN EDWARD  Additional Notes: No pain on injection. No increased resistance to injection. Injection made in 5cc increments.  Good needle visualization.  Patient tolerated procedure well.  Combined popliteal/saphenous nerve block.      

## 2015-11-04 NOTE — Op Note (Signed)
11/04/2015  12:21 PM  PATIENT:  Billy Chung    PRE-OPERATIVE DIAGNOSIS:  Closed Right Ankle Fracture Weber C with disruption of the syndesmosis  POST-OPERATIVE DIAGNOSIS:  Same  PROCEDURE:  OPEN REDUCTION INTERNAL FIXATION (ORIF) ANKLE FRACTURE Open reduction internal fixation fibular fracture. Open reduction internal fixation syndesmosis  SURGEON:  Nadara MustardMarcus V Hadli Vandemark, MD  PHYSICIAN ASSISTANT:None ANESTHESIA:   General  PREOPERATIVE INDICATIONS:  Billy Chung is a  [age] y.o. male with a diagnosis of Closed Right Ankle Fracture who failed conservative measures and elected for surgical management.    The risks benefits and alternatives were discussed with the patient preoperatively including but not limited to the risks of infection, bleeding, nerve injury, cardiopulmonary complications, the need for revision surgery, among others, and the patient was willing to proceed.  OPERATIVE IMPLANTS: 8 hole one third tubular plate  OPERATIVE FINDINGS: Disruption of the syndesmosis  OPERATIVE PROCEDURE: Patient brought the operating room after undergoing a regional block he then underwent a general anesthetic. After adequate levels anesthesia obtained patient's right lower extremity was prepped using DuraPrep draped into a sterile field a timeout was called. A lateral incision was made over the fibula this was carried down to the fracture site patient had a long oblique fracture. This was irrigated with normal saline and debrided reduced and stabilized with an interfrag screw. An anti-glide lateral plate was applied 3 proximal compression screws were applied 1 distal compression screw was applied there was some mild widening of the mortise. A syndesmotic screw was then placed and secured. Radiographs showed restoration of the mortise. The wound was irrigated with normal saline subcutaneous was closed using 2-0 Vicryl the skin was closed using staples a sterile compressive dressing was applied  patient was taken to the PACU in stable condition prescription for Percocet for pain and nonweightbearing on the right follow-up in the office in 1 week

## 2015-11-04 NOTE — Transfer of Care (Signed)
Immediate Anesthesia Transfer of Care Note  Patient: Billy Chung  Procedure(s) Performed: Procedure(s): OPEN REDUCTION INTERNAL FIXATION (ORIF) ANKLE FRACTURE (Right)  Patient Location: PACU  Anesthesia Type:General  Level of Consciousness: awake and alert   Airway & Oxygen Therapy: Patient Spontanous Breathing and Patient connected to nasal cannula oxygen  Post-op Assessment: Report given to RN  Post vital signs: Reviewed and stable  Last Vitals:  Vitals:   11/04/15 1129 11/04/15 1133  BP:    Pulse: 76 75  Resp: 16 16  Temp:      Last Pain:  Vitals:   11/04/15 1010  TempSrc:   PainSc: 2          Complications: No apparent anesthesia complications

## 2015-11-04 NOTE — Anesthesia Preprocedure Evaluation (Addendum)
Anesthesia Evaluation  Patient identified by MRN, date of birth, ID band Patient awake    Reviewed: Allergy & Precautions, NPO status , Patient's Chart, lab work & pertinent test results  Airway Mallampati: III  TM Distance: >3 FB Neck ROM: Full    Dental  (+) Teeth Intact, Dental Advisory Given, Poor Dentition, Chipped, Missing   Pulmonary former smoker,    Pulmonary exam normal breath sounds clear to auscultation       Cardiovascular Exercise Tolerance: Good negative cardio ROS Normal cardiovascular exam Rhythm:Regular Rate:Normal     Neuro/Psych negative neurological ROS  negative psych ROS   GI/Hepatic negative GI ROS, (+)     substance abuse  alcohol use,   Endo/Other  Morbid obesity  Renal/GU negative Renal ROS     Musculoskeletal negative musculoskeletal ROS (+)   Abdominal   Peds  Hematology negative hematology ROS (+)   Anesthesia Other Findings Day of surgery medications reviewed with the patient.  Reproductive/Obstetrics                             Anesthesia Physical Anesthesia Plan  ASA: III  Anesthesia Plan: General and Regional   Post-op Pain Management:  Regional for Post-op pain   Induction: Intravenous  Airway Management Planned: LMA  Additional Equipment:   Intra-op Plan:   Post-operative Plan: Extubation in OR  Informed Consent: I have reviewed the patients History and Physical, chart, labs and discussed the procedure including the risks, benefits and alternatives for the proposed anesthesia with the patient or authorized representative who has indicated his/her understanding and acceptance.   Dental advisory given  Plan Discussed with: CRNA  Anesthesia Plan Comments: (Risks/benefits of general anesthesia discussed with patient including risk of damage to teeth, lips, gum, and tongue, nausea/vomiting, allergic reactions to medications, and the  possibility of heart attack, stroke and death.  All patient questions answered.  Patient wishes to proceed.  Discussed risks and benefits of popliteal/saphenous block including failure, bleeding, infection, nerve damage, weakness. Discussed that the block may not prevent all of the pain in the ankle. Questions answered. Patient consents to block. )       Anesthesia Quick Evaluation

## 2015-11-05 NOTE — Anesthesia Postprocedure Evaluation (Signed)
Anesthesia Post Note  Patient: Billy Chung  Procedure(s) Performed: Procedure(s) (LRB): OPEN REDUCTION INTERNAL FIXATION (ORIF) ANKLE FRACTURE (Right)  Patient location during evaluation: PACU Anesthesia Type: General Level of consciousness: awake and alert Pain management: pain level controlled Vital Signs Assessment: post-procedure vital signs reviewed and stable Respiratory status: spontaneous breathing, nonlabored ventilation and respiratory function stable Cardiovascular status: blood pressure returned to baseline and stable Postop Assessment: no signs of nausea or vomiting Anesthetic complications: no    Last Vitals:  Vitals:   11/04/15 1316 11/04/15 1402  BP: 130/73 106/63  Pulse:  84  Resp:    Temp:      Last Pain:  Vitals:   11/04/15 1010  TempSrc:   PainSc: 2                  Ayala Ribble A

## 2015-11-06 ENCOUNTER — Encounter (HOSPITAL_COMMUNITY): Payer: Self-pay | Admitting: Orthopedic Surgery

## 2015-11-17 ENCOUNTER — Ambulatory Visit (INDEPENDENT_AMBULATORY_CARE_PROVIDER_SITE_OTHER): Payer: BLUE CROSS/BLUE SHIELD | Admitting: Orthopedic Surgery

## 2015-11-17 DIAGNOSIS — S8261XD Displaced fracture of lateral malleolus of right fibula, subsequent encounter for closed fracture with routine healing: Secondary | ICD-10-CM

## 2015-11-17 DIAGNOSIS — S93431D Sprain of tibiofibular ligament of right ankle, subsequent encounter: Secondary | ICD-10-CM

## 2015-12-01 ENCOUNTER — Inpatient Hospital Stay (INDEPENDENT_AMBULATORY_CARE_PROVIDER_SITE_OTHER): Payer: BLUE CROSS/BLUE SHIELD | Admitting: Orthopedic Surgery

## 2015-12-01 DIAGNOSIS — S93431D Sprain of tibiofibular ligament of right ankle, subsequent encounter: Secondary | ICD-10-CM | POA: Diagnosis not present

## 2015-12-01 DIAGNOSIS — S8261XD Displaced fracture of lateral malleolus of right fibula, subsequent encounter for closed fracture with routine healing: Secondary | ICD-10-CM | POA: Diagnosis not present

## 2015-12-23 ENCOUNTER — Telehealth (INDEPENDENT_AMBULATORY_CARE_PROVIDER_SITE_OTHER): Payer: Self-pay | Admitting: Orthopedic Surgery

## 2015-12-23 NOTE — Telephone Encounter (Signed)
Patient requesting RX refill on Percocet. Patient states to leave message if he does not pick up  Contact Info: [phone]

## 2015-12-24 ENCOUNTER — Other Ambulatory Visit (INDEPENDENT_AMBULATORY_CARE_PROVIDER_SITE_OTHER): Payer: Self-pay | Admitting: Orthopedic Surgery

## 2015-12-24 MED ORDER — OXYCODONE-ACETAMINOPHEN 5-325 MG PO TABS: 1.0000 | ORAL_TABLET | ORAL | 0 refills | Status: DC | PRN

## 2015-12-24 NOTE — Telephone Encounter (Signed)
rx written

## 2015-12-24 NOTE — Telephone Encounter (Signed)
I called to advise patient's prescription is at the front desk for pick up. 

## 2015-12-29 ENCOUNTER — Encounter (INDEPENDENT_AMBULATORY_CARE_PROVIDER_SITE_OTHER): Payer: Self-pay | Admitting: Orthopedic Surgery

## 2015-12-29 ENCOUNTER — Ambulatory Visit (INDEPENDENT_AMBULATORY_CARE_PROVIDER_SITE_OTHER): Payer: BLUE CROSS/BLUE SHIELD

## 2015-12-29 ENCOUNTER — Ambulatory Visit (INDEPENDENT_AMBULATORY_CARE_PROVIDER_SITE_OTHER): Payer: BLUE CROSS/BLUE SHIELD | Admitting: Orthopedic Surgery

## 2015-12-29 VITALS — Ht 68.0 in | Wt 280.0 lb

## 2015-12-29 DIAGNOSIS — S8264XD Nondisplaced fracture of lateral malleolus of right fibula, subsequent encounter for closed fracture with routine healing: Secondary | ICD-10-CM

## 2015-12-29 DIAGNOSIS — M25571 Pain in right ankle and joints of right foot: Secondary | ICD-10-CM | POA: Diagnosis not present

## 2015-12-29 NOTE — Progress Notes (Signed)
   Office Visit Note   Patient: Billy Chung           Date of Birth: 08/13/1960           MRN: [id number] Visit Date: 12/29/2015              Requested by: Gareth MorganSteve Knowlton, MD [address] [place], [place] [address] PCP: Milana ObeyStephen D Knowlton, MD   Assessment & Plan: Visit Diagnoses:  1. Closed nondisplaced fracture of lateral malleolus of right fibula with routine healing, subsequent encounter   2. Pain in right ankle and joints of right foot     Plan: Repeat 3 view radiographs of the right ankle at follow-up. Patient will advance to weightbearing as tolerated his regular shoe wear he is currently full weightbearing in a fracture boot. He states the crutches were uncomfortable. Patient was given instructions for heel cord stretching the importance of heel cord stretching and the technique was demonstrated to him. Patient is pleased with his progress.  Follow-Up Instructions: Return in about 4 weeks (around 01/26/2016).   Orders:  Orders Placed This Encounter  Procedures  . XR Ankle Complete Right   No orders of the defined types were placed in this encounter.     Procedures: No procedures performed   Clinical Data: No additional findings.   Subjective: Chief Complaint  Patient presents with  . Right Ankle - Routine Post Op    11/03/15 right ankle open reduction fixation    Patient is 8 weeks s/p ORIF right ankle fracture. He is full weight bearing in a fracture boot. No questions or concerns today.    Review of Systems   Objective: Vital Signs: Ht 5\' 8"  (1.727 m)   Wt 280 lb (127 kg)   BMI 42.57 kg/m   Physical Exam on examination patient has well healed lateral incision there is no redness no cellulitis no drainage no signs of infection.  Ortho Exam  Specialty Comments:  No specialty comments available.  Imaging: Xr Ankle Complete Right  Result Date: 12/29/2015 Three-view radiographs of the right ankle shows congruent mortise the fracture is  well aligned the syndesmosis is not widened.     PMFS History: There are no active problems to display for this patient.  Past Medical History:  Diagnosis Date  . Alcohol abuse   . Pneumonia     No family history on file.  Past Surgical History:  Procedure Laterality Date  . APPENDECTOMY    . ORIF ANKLE FRACTURE Right 11/04/2015   Procedure: OPEN REDUCTION INTERNAL FIXATION (ORIF) ANKLE FRACTURE;  Surgeon: Nadara MustardMarcus V Shanitra Phillippi, MD;  Location: [facility] OR;  Service: Orthopedics;  Laterality: Right;   Social History   Occupational History  . Not on file.   Social History Main Topics  . Smoking status: Former Games developermoker  . Smokeless tobacco: Current User    Types: Chew  . Alcohol use 100.8 oz/week    168 Cans of beer per week     Comment: daily  . Drug use:     Types: Cocaine     Comment: former drug use (2 years ago)  . Sexual activity: Not on file

## 2016-01-26 ENCOUNTER — Encounter (INDEPENDENT_AMBULATORY_CARE_PROVIDER_SITE_OTHER): Payer: Self-pay | Admitting: Orthopedic Surgery

## 2016-01-26 ENCOUNTER — Ambulatory Visit (INDEPENDENT_AMBULATORY_CARE_PROVIDER_SITE_OTHER): Payer: Self-pay

## 2016-01-26 ENCOUNTER — Ambulatory Visit (INDEPENDENT_AMBULATORY_CARE_PROVIDER_SITE_OTHER): Payer: BLUE CROSS/BLUE SHIELD | Admitting: Orthopedic Surgery

## 2016-01-26 VITALS — Ht 68.0 in | Wt 280.0 lb

## 2016-01-26 DIAGNOSIS — S82891D Other fracture of right lower leg, subsequent encounter for closed fracture with routine healing: Secondary | ICD-10-CM

## 2016-01-26 DIAGNOSIS — M25571 Pain in right ankle and joints of right foot: Secondary | ICD-10-CM | POA: Diagnosis not present

## 2016-01-26 NOTE — Progress Notes (Signed)
   Office Visit Note   Patient: Billy LuriaRandall H Chung           Date of Birth: 06/06/1960           MRN: [id number] Visit Date: 01/26/2016              Requested by: Gareth MorganSteve Knowlton, MD [address] [place], [place] [address] PCP: Milana ObeyStephen D Knowlton, MD   Assessment & Plan: Visit Diagnoses:  1. Ankle fracture, right, closed, with routine healing, subsequent encounter   2. Pain in right ankle and joints of right foot     Plan: Follow-up as needed. Patient is completely asymptomatic he is wearing regular boots. He has a little bit of swelling recommend he continue wearing his medical compression socks.  Follow-Up Instructions: Return if symptoms worsen or fail to improve.   Orders:  Orders Placed This Encounter  Procedures  . XR Ankle Complete Right   No orders of the defined types were placed in this encounter.     Procedures: No procedures performed   Clinical Data: No additional findings.   Subjective: Chief Complaint  Patient presents with  . Right Ankle - Routine Post Op    11/03/15 right ankle open reduction fixation 12 weeks out    Patient is 12 weeks out from a right ankle open reduction fixation. He is full weight bearing in a regular shoe and states that he does have some occasional swelling and tenderness at the end of the day but otherwise he is ok. "today is my last visit"    Review of Systems   Objective: Vital Signs: Ht 5\' 8"  (1.727 m)   Wt 280 lb (127 kg)   BMI 42.57 kg/m   Physical Exam On examination patient has minimal swelling he has full range of motion of his ankle he is asymptomatic with activities of daily living.  Ortho Exam  Specialty Comments:  No specialty comments available.  Imaging: Xr Ankle Complete Right  Result Date: 01/26/2016 Three-view radiographs of the right ankle shows a congruent mortise. Fracture is well-healed no complicating features. 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    PMFS History: Patient Active Problem List   Diagnosis Date Noted  . Ankle fracture, right, closed, with routine healing, subsequent encounter 01/26/2016   Past Medical History:  Diagnosis Date  . Alcohol abuse   . Pneumonia     No family history on file.  Past Surgical History:  Procedure Laterality Date  . APPENDECTOMY    . ORIF ANKLE FRACTURE Right 11/04/2015   Procedure: OPEN REDUCTION INTERNAL FIXATION (ORIF) ANKLE FRACTURE;  Surgeon: Nadara MustardMarcus V Deloros Beretta, MD;  Location: [facility] OR;  Service: Orthopedics;  Laterality: Right;   Social History   Occupational History  . Not on file.   Social History Main Topics  . Smoking status: Former Games developermoker  . Smokeless tobacco: Current User    Types: Chew  . Alcohol use 100.8 oz/week    168 Cans of beer per week     Comment: daily  . Drug use:     Types: Cocaine     Comment: former drug use (2 years ago)  . Sexual activity: Not on file

## 2016-05-20 ENCOUNTER — Ambulatory Visit (INDEPENDENT_AMBULATORY_CARE_PROVIDER_SITE_OTHER): Payer: BLUE CROSS/BLUE SHIELD | Admitting: Family

## 2016-05-20 ENCOUNTER — Encounter (INDEPENDENT_AMBULATORY_CARE_PROVIDER_SITE_OTHER): Payer: Self-pay | Admitting: Family

## 2016-05-20 ENCOUNTER — Ambulatory Visit (INDEPENDENT_AMBULATORY_CARE_PROVIDER_SITE_OTHER): Payer: Self-pay

## 2016-05-20 VITALS — Ht 68.0 in | Wt 280.0 lb

## 2016-05-20 DIAGNOSIS — M25561 Pain in right knee: Secondary | ICD-10-CM

## 2016-05-20 DIAGNOSIS — M76821 Posterior tibial tendinitis, right leg: Secondary | ICD-10-CM | POA: Diagnosis not present

## 2016-05-20 DIAGNOSIS — M7661 Achilles tendinitis, right leg: Secondary | ICD-10-CM | POA: Diagnosis not present

## 2016-05-20 DIAGNOSIS — M25571 Pain in right ankle and joints of right foot: Secondary | ICD-10-CM

## 2016-05-20 DIAGNOSIS — G8929 Other chronic pain: Secondary | ICD-10-CM

## 2016-05-20 MED ORDER — NITROGLYCERIN 0.2 MG/HR TD PT24: 0.2000 mg | MEDICATED_PATCH | Freq: Every day | TRANSDERMAL | 12 refills | Status: DC

## 2016-05-20 MED ORDER — NAPROXEN 500 MG PO TABS: 500.0000 mg | ORAL_TABLET | Freq: Two times a day (BID) | ORAL | 0 refills | Status: DC | PRN

## 2016-05-20 NOTE — Addendum Note (Signed)
Addended by: Barnie Del R on: 05/20/2016 04:05 PM   Modules accepted: Orders

## 2016-05-20 NOTE — Progress Notes (Signed)
Office Visit Note   Patient: Billy Chung           Date of Birth: 04-Aug-1960           MRN: [id number] Visit Date: 05/20/2016              Requested by: Gareth Morgan, MD [address] [place], [place] [address] PCP: Milana Obey, MD  Chief Complaint  Patient presents with  . Right Ankle - Pain    10/2015 ORIF ankle fracture      HPI: The patient is a 56 year old gentleman who presents today complaining of right ankle pain. He is status post open reduction internal fixation of this ankle in September of last year. States he was pain-free until he was cutting some wide in February of this year which has reaggravated his ankle. Complaining of pain with weightbearing increased swelling pain with activities. States he is only able to work for about 2 hours before he must go home due to swelling and pain.  Of note he does state that he is tired of going to the local drug dealer" for his pain medication.  Assessment & Plan: Visit Diagnoses:  1. Achilles tendinitis, right leg   2. Chronic pain of right knee   3. Pain in joint involving right ankle and foot   4. Posterior tibial tendinitis, right leg     Plan: Have provided him with a heel lift to wear he will resume his fracture boot on the right with the 2 heel lifts have provided. Provided prescriptions for nitroglycerin patches as well as naproxen which she will take twice daily. Declined to provide narcotic pain medication. At this point patient states "that fine I'll just keep getting anywhere I've been getting it."  Follow-Up Instructions: Return in about 4 weeks (around 06/17/2016).   Right Ankle Exam  Swelling: moderate  Tenderness  Right ankle tenderness location: Achilles and posterior tibial tendon.    Tests  Anterior drawer: negative Varus tilt: negative  Other  Erythema: absent   Comments:  Tender along the Achilles throughout. Also tender medially along the posterior tibial tendon. Has pain with  dorsiflexion as well as resisted inversion.  No bony tenderness.      Patient is alert, oriented, no adenopathy, well-dressed, normal affect, normal respiratory effort.   Imaging: Xr Ankle Complete Right  Result Date: 05/20/2016 Radiographs of the right ankle show stable fixation hardware. The mortise is congruent. There is some spurring of the medial malleolus. No acute finding.   Labs: No results found for: HGBA1C, ESRSEDRATE, CRP, LABURIC, REPTSTATUS, GRAMSTAIN, CULT, LABORGA  Orders:  Orders Placed This Encounter  Procedures  . XR Ankle Complete Right   No orders of the defined types were placed in this encounter.    Procedures: No procedures performed  Clinical Data: No additional findings.  ROS:  All other systems negative, except as noted in the HPI. Review of Systems  Constitutional: Negative for chills and fever.  Cardiovascular: Negative for leg swelling.  Musculoskeletal: Positive for arthralgias and myalgias.  Skin: Negative for color change and wound.  Neurological: Negative for weakness and numbness.    Objective: Vital Signs: Ht  (1.727 m)   Wt 280 lb (127 kg)   BMI 42.57 kg/m   Specialty Comments:  No specialty comments available.  PMFS History: Patient Active Problem List   Diagnosis Date Noted  . Ankle fracture, right, closed, with routine healing, subsequent encounter 01/26/2016   Past Medical History:  Diagnosis Date  . Alcohol abuse   . Pneumonia     No family history on file.  Past Surgical History:  Procedure Laterality Date  . APPENDECTOMY    . ORIF ANKLE FRACTURE Right 11/04/2015   Procedure: OPEN REDUCTION INTERNAL FIXATION (ORIF) ANKLE FRACTURE;  Surgeon: Nadara Mustard, MD;  Location: [facility] OR;  Service: Orthopedics;  Laterality: Right;   Social History   Occupational History  . Not on file.   Social History Main Topics  . Smoking status: Former Games developer  . Smokeless tobacco: Current User    Types: Chew  .  Alcohol use 100.8 oz/week    168 Cans of beer per week     Comment: daily  . Drug use: Yes    Types: Cocaine     Comment: former drug use (2 years ago)  . Sexual activity: Not on file

## 2016-06-07 ENCOUNTER — Ambulatory Visit (INDEPENDENT_AMBULATORY_CARE_PROVIDER_SITE_OTHER): Payer: BLUE CROSS/BLUE SHIELD | Admitting: Orthopedic Surgery

## 2016-06-07 ENCOUNTER — Encounter (INDEPENDENT_AMBULATORY_CARE_PROVIDER_SITE_OTHER): Payer: Self-pay | Admitting: Orthopedic Surgery

## 2016-06-07 DIAGNOSIS — M25571 Pain in right ankle and joints of right foot: Secondary | ICD-10-CM

## 2016-06-07 DIAGNOSIS — M12571 Traumatic arthropathy, right ankle and foot: Secondary | ICD-10-CM | POA: Insufficient documentation

## 2016-06-07 MED ORDER — LIDOCAINE HCL 1 % IJ SOLN
2.0000 mL | INTRAMUSCULAR | Status: AC | PRN
  Administered 2016-06-07: 2 mL

## 2016-06-07 MED ORDER — METHYLPREDNISOLONE ACETATE 40 MG/ML IJ SUSP
40.0000 mg | INTRAMUSCULAR | Status: AC | PRN
  Administered 2016-06-07: 40 mg via INTRA_ARTICULAR
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
## 2016-06-23 ENCOUNTER — Telehealth (INDEPENDENT_AMBULATORY_CARE_PROVIDER_SITE_OTHER): Payer: Self-pay | Admitting: Radiology

## 2016-06-23 NOTE — Telephone Encounter (Signed)
Made appointment

## 2016-06-27 ENCOUNTER — Encounter (INDEPENDENT_AMBULATORY_CARE_PROVIDER_SITE_OTHER): Payer: Self-pay | Admitting: Orthopedic Surgery

## 2016-06-27 ENCOUNTER — Ambulatory Visit (INDEPENDENT_AMBULATORY_CARE_PROVIDER_SITE_OTHER): Payer: BLUE CROSS/BLUE SHIELD | Admitting: Orthopedic Surgery

## 2016-06-27 VITALS — Ht 68.0 in | Wt 280.0 lb

## 2016-06-27 DIAGNOSIS — M12571 Traumatic arthropathy, right ankle and foot: Secondary | ICD-10-CM | POA: Diagnosis not present

## 2016-06-27 MED ORDER — LIDOCAINE HCL 1 % IJ SOLN
2.0000 mL | INTRAMUSCULAR | Status: AC | PRN
  Administered 2016-06-27: 2 mL

## 2016-06-27 MED ORDER — METHYLPREDNISOLONE ACETATE 40 MG/ML IJ SUSP
40.0000 mg | INTRAMUSCULAR | Status: AC | PRN
  Administered 2016-06-27: 40 mg via INTRA_ARTICULAR

## 2016-06-27 NOTE — Progress Notes (Signed)
Office Visit Note   Patient: Billy Chung           Date of Birth: 1960/07/01           MRN: [id number] Visit Date: 06/27/2016              Requested by: Gareth Morgan, MD [address], [place] [address] PCP: Gareth Morgan, MD  Chief Complaint  Patient presents with  . Right Ankle - Follow-up    OPEN REDUCTION INTERNAL FIXATION (ORIF) ANKLE FRACTURE ~8 months post op, right ankle pain, wanting to discuss other options due to severe pain      HPI: Patient is a follow-up for traumatic arthritis right ankle status post open reduction internal fixation for a Weber C fibular fracture. 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  Assessment & Plan: Visit Diagnoses:  1. Traumatic arthritis of ankle, right     Plan: Recommended a steroid injection. Patient tolerated this well have recommended a ankle fusion versus a total ankle arthroplasty. With patient's elevated BMI and heavy laborer he would not be a good candidate for a total ankle. Patient states that he is an alcoholic and is going to have difficulty being compliant with protected weightbearing postoperatively.  Follow-Up Instructions: Return if symptoms worsen or fail to improve.   Ortho Exam  Patient is alert, oriented, no adenopathy, well-dressed, normal affect, normal respiratory effort. Examination patient has antalgic gait he has good pulses he has significant swelling the right lower extremity compared to the left there is no cellulitis there is no pain with passive range of motion of the ankle. He is tender to palpation along the anterior joint line of the ankle. Radiographs shows widening of the fibula with the syndesmosis disruption as well as fracture through the lateral tibial plateau.  Imaging: No results found.  Labs: No results found for: HGBA1C, ESRSEDRATE, CRP, LABURIC, REPTSTATUS, GRAMSTAIN, CULT, LABORGA  Orders:  No orders of the defined types were  placed in this encounter.  No orders of the defined types were placed in this encounter.    Procedures: Medium Joint Inj Date/Time: 06/27/2016 9:47 AM Performed by: DUDA, MARCUS V Authorized by: Nadara Mustard   Consent Given by:  Patient Site marked: the procedure site was marked   Timeout: prior to procedure the correct patient, procedure, and site was verified   Indications:  Pain and diagnostic evaluation Location:  Ankle Site:  R ankle Prep: patient was prepped and draped in usual sterile fashion   Needle Size:  22 G Needle Length:  1.5 inches Approach:  Anteromedial Ultrasound Guided: No   Fluoroscopic Guidance: No   Medications:  2 mL lidocaine 1 %; 40 mg methylPREDNISolone acetate 40 MG/ML Aspiration Attempted: No   Patient tolerance:  Patient tolerated the procedure well with no immediate complications    Clinical Data: No additional findings.  ROS:  All other systems negative, except as noted in the HPI. Review of Systems  Objective: Vital Signs: Ht 5\' 8"  (1.727 m)   Wt 280 lb (127 kg)   BMI 42.57 kg/m   Specialty Comments:  No specialty comments available.  PMFS History: Patient Active Problem List   Diagnosis Date Noted  . Traumatic arthritis of ankle, right 06/07/2016  . Ankle fracture, right, closed, with routine healing, subsequent encounter 01/26/2016   Past Medical History:  Diagnosis Date  . Alcohol abuse   . Pneumonia  History reviewed. No pertinent family history.  Past Surgical History:  Procedure Laterality Date  . APPENDECTOMY    . ORIF ANKLE FRACTURE Right 11/04/2015   Procedure: OPEN REDUCTION INTERNAL FIXATION (ORIF) ANKLE FRACTURE;  Surgeon: Nadara MustardMarcus V Duda, MD;  Location: [facility];  Service: Orthopedics;  Laterality: Right;   Social History   Occupational History  . Not on file.   Social History Main Topics  . Smoking status: Former Games developermoker  . Smokeless tobacco: Current User    Types: Chew  . Alcohol use 100.8 oz/week     168 Cans of beer per week     Comment: daily  . Drug use: Yes    Types: Cocaine     Comment: former drug use (2 years ago)  . Sexual activity: Not on file

## 2016-06-27 NOTE — Progress Notes (Signed)
r 

## 2016-06-28 ENCOUNTER — Ambulatory Visit (INDEPENDENT_AMBULATORY_CARE_PROVIDER_SITE_OTHER): Payer: BLUE CROSS/BLUE SHIELD | Admitting: Orthopedic Surgery

## 2017-04-11 DIAGNOSIS — M19171 Post-traumatic osteoarthritis, right ankle and foot: Secondary | ICD-10-CM | POA: Insufficient documentation

## 2017-07-05 ENCOUNTER — Other Ambulatory Visit (HOSPITAL_BASED_OUTPATIENT_CLINIC_OR_DEPARTMENT_OTHER): Payer: Self-pay

## 2017-07-05 DIAGNOSIS — G4733 Obstructive sleep apnea (adult) (pediatric): Secondary | ICD-10-CM

## 2017-07-10 ENCOUNTER — Ambulatory Visit: Payer: BLUE CROSS/BLUE SHIELD | Attending: Neurology | Admitting: Neurology

## 2017-07-10 DIAGNOSIS — G4733 Obstructive sleep apnea (adult) (pediatric): Secondary | ICD-10-CM | POA: Insufficient documentation

## 2017-07-10 DIAGNOSIS — Z79899 Other long term (current) drug therapy: Secondary | ICD-10-CM | POA: Diagnosis not present

## 2017-07-13 NOTE — Procedures (Signed)
  [facility] Lukis Bunt A. Gerilyn Pilgrim, MD     [email or web address]             NOCTURNAL POLYSOMNOGRAPHY   LOCATION: [facility]   Patient Name: Billy Chung, Billy Chung Date: 07/10/2017 Gender: Male D.O.B: 04/25/1960 Age (years): 36 Referring Provider: Jorge Mandril NP Height (inches): 69 Interpreting Physician: Beryle Beams MD, ABSM Weight (lbs): 288 RPSGT: Alfonso Ellis BMI: 43 MRN: [id number] Neck Size: 20.50 <br> <br> CLINICAL INFORMATION Sleep Study Type: NPSG    Indication for sleep study: N/A    Epworth Sleepiness Score: 13    SLEEP STUDY TECHNIQUE As per the AASM Manual for the Scoring of Sleep and Associated Events v2.3 (April 2016) with a hypopnea requiring 4% desaturations.  The channels recorded and monitored were frontal, central and occipital EEG, electrooculogram (EOG), submentalis EMG (chin), nasal and oral airflow, thoracic and abdominal wall motion, anterior tibialis EMG, snore microphone, electrocardiogram, and pulse oximetry.  MEDICATIONS Medications self-administered by patient taken the night of the study : N/A  Current Outpatient Medications:  .  naproxen (NAPROSYN) 500 MG tablet, Take 1 tablet (500 mg total) by mouth 2 (two) times daily as needed for mild pain or moderate pain. With meals, Disp: 60 tablet, Rfl: 0 .  nitroGLYCERIN (NITRODUR - DOSED IN MG/24 HR) 0.2 mg/hr patch, Place 1 patch (0.2 mg total) onto the skin daily., Disp: 30 patch, Rfl: 12 .  oxyCODONE-acetaminophen (PERCOCET) 5-325 MG tablet, Take 1-2 tablets by mouth every 6 (six) hours as needed. (Patient not taking: Reported on 06/07/2016), Disp: 6 tablet, Rfl: 0 .  oxyCODONE-acetaminophen (ROXICET) 5-325 MG tablet, Take 1 tablet by mouth every 4 (four) hours as needed for severe pain. (Patient not taking: Reported on 06/07/2016), Disp: 60 tablet, Rfl: 0     SLEEP ARCHITECTURE The study was initiated at 9:33:49 PM and ended at 4:36:27 AM.  Sleep onset time was 15.8  minutes and the sleep efficiency was 67.6%%. The total sleep time was 285.8 minutes.  Stage REM latency was 84.5 minutes.  The patient spent 2.8%% of the night in stage N1 sleep, 80.9%% in stage N2 sleep, 10.0%% in stage N3 and 6.30% in REM.  Alpha intrusion was absent.  Supine sleep was 0.00%.  RESPIRATORY PARAMETERS The overall apnea/hypopnea index (AHI) was 72.6 per hour. There were 75 total apneas, including 66 obstructive, 0 central and 9 mixed apneas. There were 271 hypopneas and 0 RERAs.  The AHI during Stage REM sleep was 46.7 per hour.  AHI while supine was N/A per hour.  The mean oxygen saturation was 86.8%. The minimum SpO2 during sleep was 51.0%.  moderate snoring was noted during this study.  CARDIAC DATA The 2 lead EKG demonstrated sinus rhythm. The mean heart rate was N/A beats per minute. Other EKG findings include: None. LEG MOVEMENT DATA The total PLMS were 0 with a resulting PLMS index of 0.0. Associated arousal with leg movement index was 0.2.  IMPRESSIONS - Severe obstructive sleep apnea is documented with this study. Auto Pap 10-20 is recommended.  Argie Ramming, MD Diplomate, American Board of Sleep Medicine. [facility] PH: [phone]   FX: [phone] ACCREDITED BY THE AMERICAN ACADEMY OF SLEEP MEDICINE

## 2021-02-09 ENCOUNTER — Encounter: Payer: Self-pay | Admitting: *Deleted

## 2021-02-17 ENCOUNTER — Encounter: Payer: Self-pay | Admitting: *Deleted

## 2021-02-24 ENCOUNTER — Encounter: Payer: Self-pay | Admitting: Urology

## 2021-02-24 ENCOUNTER — Ambulatory Visit (INDEPENDENT_AMBULATORY_CARE_PROVIDER_SITE_OTHER): Payer: 59 | Admitting: Urology

## 2021-02-24 ENCOUNTER — Other Ambulatory Visit: Payer: Self-pay

## 2021-02-24 VITALS — BP 150/82 | HR 86 | Wt 280.0 lb

## 2021-02-24 DIAGNOSIS — Z3009 Encounter for other general counseling and advice on contraception: Secondary | ICD-10-CM

## 2021-02-24 MED ORDER — ALPRAZOLAM 1 MG PO TABS: ORAL_TABLET | ORAL | 0 refills | Status: DC

## 2021-02-24 NOTE — Progress Notes (Signed)
Urological Symptom Review  Patient is experiencing the following symptoms: Frequent urination Hard to postpone urination Get up at night to urinate Leakage of urine   Review of Systems  Gastrointestinal (upper)  : Negative for upper GI symptoms  Gastrointestinal (lower) : Negative for lower GI symptoms  Constitutional : Night Sweats  Skin: Negative for skin symptoms  Eyes: Negative for eye symptoms  Ear/Nose/Throat : Negative for Ear/Nose/Throat symptoms  Hematologic/Lymphatic: Negative for Hematologic/Lymphatic symptoms  Cardiovascular : Negative for cardiovascular symptoms  Respiratory : Negative for respiratory symptoms  Endocrine: Negative for endocrine symptoms  Musculoskeletal: Back pain Joint pain  Neurological: Negative for neurological symptoms  Psychologic: Negative for psychiatric symptoms

## 2021-02-24 NOTE — Progress Notes (Signed)
Assessment: 1. Encounter for vasectomy counseling     Plan: Schedule for vasectomy Consent signed Rx for alprazolam 1 mg provided  Chief Complaint:  Chief Complaint  Patient presents with   VAS Consult    History of Present Illness:  Billy Chung is a 61 y.o. year old male who is seen in consultation from Lemmie Evens, MD  for evaluation for vasectomy.  He is separated with 3 children.  He has a remote history of scrotal trauma.  No history of infection.   Past Medical History:  Past Medical History:  Diagnosis Date   Alcohol abuse    Pneumonia     Past Surgical History:  Past Surgical History:  Procedure Laterality Date   APPENDECTOMY     ORIF ANKLE FRACTURE Right 11/04/2015   Procedure: OPEN REDUCTION INTERNAL FIXATION (ORIF) ANKLE FRACTURE;  Surgeon: Newt Minion, MD;  Location: [place];  Service: Orthopedics;  Laterality: Right;    Allergies:  Allergies  Allergen Reactions   No Known Allergies     Family History:  History reviewed. No pertinent family history.  Social History:  Social History   Tobacco Use   Smoking status: Former   Smokeless tobacco: Current    Types: Chew  Substance Use Topics   Alcohol use: Yes    Alcohol/week: 168.0 standard drinks    Types: 168 Cans of beer per week    Comment: daily   Drug use: Yes    Types: Cocaine    Comment: former drug use (2 years ago)    Review of symptoms:  Constitutional:  Negative for unexplained weight loss, night sweats, fever, chills ENT:  Negative for nose bleeds, sinus pain, painful swallowing CV:  Negative for chest pain, shortness of breath, exercise intolerance, palpitations, loss of consciousness Resp:  Negative for cough, wheezing, shortness of breath GI:  Negative for nausea, vomiting, diarrhea, bloody stools GU:  Positives noted in HPI; otherwise negative for gross hematuria, dysuria, urinary incontinence Neuro:  Negative for seizures, poor balance, limb weakness, slurred  speech Psych:  Negative for lack of energy, depression, anxiety Endocrine:  Negative for polydipsia, polyuria, symptoms of hypoglycemia (dizziness, hunger, sweating) Hematologic:  Negative for anemia, purpura, petechia, prolonged or excessive bleeding, use of anticoagulants  Allergic:  Negative for difficulty breathing or choking as a result of exposure to anything; no shellfish allergy; no allergic response (rash/itch) to materials, foods  Physical exam: BP (!) 150/82 (BP Location: Right Arm)    Pulse 86    Wt 280 lb (127 kg)    BMI 42.57 kg/m  GENERAL APPEARANCE:  Well appearing, well developed, well nourished, NAD HEENT:  Atraumatic, normocephalic, oropharynx clear NECK:  Supple without lymphadenopathy or thyromegaly ABDOMEN:  Soft, non-tender, no masses EXTREMITIES:  Moves all extremities well, without clubbing, cyanosis, or edema NEUROLOGIC:  Alert and oriented x 3, normal gait, CN II-XII grossly intact MENTAL STATUS:  appropriate BACK:  Non-tender to palpation, No CVAT SKIN:  Warm, dry, and intact GU: Penis:  circumcised; piercing on dorsal shaft Meatus: Normal Scrotum: thick cords bilaterally, vas palpated bilaterally Testis: normal without masses bilateral Epididymis: normal  Results: None  VASECTOMY CONSULTATION  Billy Chung presents for vasectomy consultation today.  He is a 61 y.o. male, Divorced with 3  children .  He is aware of the issues regarding long-term fertility and are comfortable with this decision.  He presents for consideration for vasectomy.  I discussed the issues in detail with him today and  he expressed no reservations.  As to the procedure, no scalpel technique vasectomy is explained and reviewed in detail.  Generalized risks including but not limited to bleeding, infection, orchalgia, testicular atrophy, epididymitis, scrotal hematoma, and chronic pain are discussed.   Additionally, he understands that the possibility of vas recanalization  following vasectomy is possible although rare.  Most importantly, the patient understands that he is not sterile initially and will need a semen analysis check to confirm sterility such that no sperm are seen.  He is advised to avoid ejaculation for 10 days following the procedure.  The initial semen analysis will be checked in approximately 12 weeks and in some patients, several months may be required for clearance of all sperm.  He reports a clear understanding of the need for continued birth control until sterility is confirmed.  Otherwise, general issues regarding local anesthesia, prep, alprazolam are discussed and he reports a clear understanding.

## 2021-02-24 NOTE — Patient Instructions (Signed)
Taking care of yourself after a VASECTOMY ? ?                                            Patient Information Sheet ? ?      ?The following information will reinforce some of the instructions that your doctor has given you. ? ?Day of Procedure: ?1) Wear the scrotal supporter and gauze pad ?2) Use an ice pack on the scrotum for 15 minutes every hour for 48 hours to help reduce discomfort, swelling and bruising (do NOT place ice directly on your skin, but place on top of the supporter) ?3) Expect some clear to pinkish drainage at the surgical site for the first 24-48 hours ?4) If needed, use pain medications provided or ibuprofen 800 mg every 8 hours for discomfort ?5) Avoid strenuous activities like mowing, lifting, jogging and exercising for 1 week.  Take it easy! ?6) If you develop a fever over 101 F or sudden onset of significant swelling within the first 12 hours, please call to report this to your doctor as soon as possible.   ? ? ?Day Two and Three: ?1) You may take a shower, but avoid tubs, pools or hot tubs. ?2) Continue to wear the scrotal supporter as needed for comfort and change or remove the gauze pad if desired ?3) Keep taking it easy!  Avoid strenuous activities like mowing, lifting, jogging and exercising.   ?4) Continue to watch for signs or symptoms of fever or significant swelling ?5) Apply a small amount of antibiotic ointment to incision 1-2 times/day ? ?The rest of the week: ?1) Gradually return to normal physical activities after one week.  A return of soreness might mean you are  ?      ?doing too much too soon?. ?2) Avoid sexual activity for 10 days after the procedure ?3) Continue to take a shower, but avoid tubs, pools or hot tubs ?4) Wearing the scrotal supporter is optional based on your comfort.   ? ? ?Remember to use an alternate form of contraception for 3 months until you have been checked and CLEARED by your urologist! ? ?3-Month Urology Clinic: ? 1) The urologist will need to look at  a semen sample under a microscope ? 2) Use the specimen cup provided to collect the sample AT HOME 1 hour before the appointment ? 3) DO NOT refrigerate the specimen, but keep at room or body temperature ? 4) Avoid ejaculation for 2-5 days before collecting the specimen ? 5) Collect the entire specimen by masturbation using NO lubricant ? 6) Make sure your name, MR number, date and time of collection are on the cup  ?

## 2021-03-09 ENCOUNTER — Emergency Department (HOSPITAL_COMMUNITY): Payer: 59

## 2021-03-09 ENCOUNTER — Encounter (HOSPITAL_COMMUNITY): Payer: Self-pay | Admitting: *Deleted

## 2021-03-09 ENCOUNTER — Emergency Department (HOSPITAL_COMMUNITY)
Admission: EM | Admit: 2021-03-09 | Discharge: 2021-03-09 | Disposition: A | Discharge status: Home / Self Care | Payer: 59 | Attending: Emergency Medicine | Admitting: Emergency Medicine

## 2021-03-09 DIAGNOSIS — R0789 Other chest pain: Secondary | ICD-10-CM | POA: Diagnosis present

## 2021-03-09 DIAGNOSIS — I2510 Atherosclerotic heart disease of native coronary artery without angina pectoris: Secondary | ICD-10-CM | POA: Insufficient documentation

## 2021-03-09 DIAGNOSIS — R000 Tachycardia, unspecified: Secondary | ICD-10-CM | POA: Insufficient documentation

## 2021-03-09 DIAGNOSIS — R079 Chest pain, unspecified: Secondary | ICD-10-CM

## 2021-03-09 DIAGNOSIS — I10 Essential (primary) hypertension: Secondary | ICD-10-CM | POA: Insufficient documentation

## 2021-03-09 DIAGNOSIS — E119 Type 2 diabetes mellitus without complications: Secondary | ICD-10-CM | POA: Insufficient documentation

## 2021-03-09 LAB — URINALYSIS, ROUTINE W REFLEX MICROSCOPIC
Bilirubin Urine: NEGATIVE
Glucose, UA: NEGATIVE mg/dL
Hgb urine dipstick: NEGATIVE
Ketones, ur: NEGATIVE mg/dL
Leukocytes,Ua: NEGATIVE
Nitrite: NEGATIVE
Protein, ur: NEGATIVE mg/dL
Specific Gravity, Urine: 1.001 — ABNORMAL LOW (ref 1.005–1.030)
pH: 6 (ref 5.0–8.0)

## 2021-03-09 LAB — CBC
HCT: 41.5 % (ref 39.0–52.0)
Hemoglobin: 14.1 g/dL (ref 13.0–17.0)
MCH: 33.3 pg (ref 26.0–34.0)
MCHC: 34 g/dL (ref 30.0–36.0)
MCV: 97.9 fL (ref 80.0–100.0)
Platelets: 170 10*3/uL (ref 150–400)
RBC: 4.24 MIL/uL (ref 4.22–5.81)
RDW: 13.6 % (ref 11.5–15.5)
WBC: 9.1 10*3/uL (ref 4.0–10.5)
nRBC: 0 % (ref 0.0–0.2)

## 2021-03-09 LAB — TROPONIN I (HIGH SENSITIVITY)
Troponin I (High Sensitivity): 6 ng/L (ref <18)
Troponin I (High Sensitivity): 7 ng/L (ref <18)

## 2021-03-09 LAB — BASIC METABOLIC PANEL
Anion gap: 6 (ref 5–15)
BUN: 6 mg/dL — ABNORMAL LOW (ref 8–23)
CO2: 24 mmol/L (ref 22–32)
Calcium: 8.5 mg/dL — ABNORMAL LOW (ref 8.9–10.3)
Chloride: 96 mmol/L — ABNORMAL LOW (ref 98–111)
Creatinine, Ser: 0.67 mg/dL (ref 0.61–1.24)
GFR, Estimated: >60 mL/min (ref >60)
Glucose, Bld: 108 mg/dL — ABNORMAL HIGH (ref 70–99)
Potassium: 3.8 mmol/L (ref 3.5–5.1)
Sodium: 126 mmol/L — ABNORMAL LOW (ref 135–145)

## 2021-03-09 MED ORDER — SODIUM CHLORIDE 0.9 % IV BOLUS
1000.0000 mL | Freq: Once | INTRAVENOUS | Status: AC
  Administered 2021-03-09: 19:00:00 1000 mL via INTRAVENOUS

## 2021-03-09 NOTE — ED Triage Notes (Signed)
Chest pain intermittent for sometime, c/o shortness of breath and heart racing

## 2021-03-09 NOTE — Discharge Instructions (Addendum)
As we discussed your work-up today is reassuring, does not appear that you are currently having a heart attack, from my evaluation you have good blood flow to both of your lower extremities, I do not think that you are having ischemia that is causing some cold sensation of both of his legs.  However your description of heart palpitations, waking from sleep with chest pain, as well as becoming short of breath with exertion are concerning to [place] and we do recommend that you follow-up with cardiology at your earliest convenience.  I have attached the number for cardiologist in [place] for you to call and schedule an appointment with.  In the meantime if you develop new chest pain, shortness of breath please return to the emergency department for further evaluation.

## 2021-03-09 NOTE — ED Provider Notes (Signed)
[facility] Provider Note   CSN: [id number] Arrival date & time: 03/09/21  1617     History  Chief Complaint  Patient presents with   Chest Pain   Shortness of Breath    Billy Chung is a 61 y.o. male This is a 61 year old patient who endorses past medical history that is not significant for hypertension, coronary artery disease, diabetes, hypercholesterolemia.  Patient is notably obese, he also endorses a history of recreational drug use including cocaine, marijuana, although he is not currently actively using these drugs.  Patient reports a 58-month history of intermittent stabbing left-sided chest pain that feels like an ice pick, feeling of heart racing, he also reports that he feels like his lower legs are cold.  Patient also reports that he becomes short of breath with minimal exertion despite no history of lung disease.  Patient has not been seen by cardiologist in the past.  Patient denies active chest pain at this time.  Patient does report that his palpitations, chest pain sometimes wake him up from sleep with a feeling that his heart is racing, he has never passed out or felt lightheaded from this but he does feel like he is going to die.  Patient does have a primary care provider but does not go to the doctor often unless he feels that he is ill.  No medication taken prior to arrival.   Chest Pain Associated symptoms: shortness of breath   Shortness of Breath Associated symptoms: chest pain       Home Medications Prior to Admission medications   Medication Sig Start Date End Date Taking? Authorizing Provider  ALPRAZolam Duanne Moron) 1 MG tablet Take 1 tablet by mouth 1 hour prior to procedure 02/24/21   Stoneking, Reece Leader., MD      Allergies    No known allergies    Review of Systems   Review of Systems  Respiratory:  Positive for shortness of breath.   Cardiovascular:  Positive for chest pain.  All other systems reviewed and are  negative.  Physical Exam Updated Vital Signs BP 122/73 (BP Location: Left Arm)    Pulse 71    Temp 97.7 F (36.5 C) (Oral)    Resp 18    Wt 125.7 kg    SpO2 95%    BMI 42.15 kg/m  Physical Exam Vitals and nursing note reviewed.  Constitutional:      General: He is not in acute distress.    Appearance: Normal appearance. He is obese.  HENT:     Head: Normocephalic and atraumatic.  Eyes:     General:        Right eye: No discharge.        Left eye: No discharge.  Cardiovascular:     Rate and Rhythm: Normal rate and regular rhythm.     Heart sounds: No murmur heard.   No friction rub. No gallop.     Comments: No JVD noted.  Intact DP, PT pulses bilaterally.  Intact radial, ulnar pulses bilaterally.  His pulses are strong, and equal bilaterally Pulmonary:     Effort: Pulmonary effort is normal.     Breath sounds: Normal breath sounds.     Comments: Clear breath sounds throughout, no wheezing, stridor, rhonchi, rales.  No sign of respiratory distress. Abdominal:     General: Bowel sounds are normal.     Palpations: Abdomen is soft.  Musculoskeletal:     Cervical back: Neck supple. No tenderness.  Comments: No significant lower extremity edema  Skin:    General: Skin is warm and dry.     Capillary Refill: Capillary refill takes less than 2 seconds. Good capillary refill throughout all 4 extremities distally Neurological:     Mental Status: He is alert and oriented to person, place, and time.  Psychiatric:        Mood and Affect: Mood normal.        Behavior: Behavior normal.    ED Results / Procedures / Treatments   Labs (all labs ordered are listed, but only abnormal results are displayed) Labs Reviewed  BASIC METABOLIC PANEL - Abnormal; Notable for the following components:      Result Value   Sodium 126 (*)    Chloride 96 (*)    Glucose, Bld 108 (*)    BUN 6 (*)    Calcium 8.5 (*)    All other components within normal limits  URINALYSIS, ROUTINE W REFLEX  MICROSCOPIC - Abnormal; Notable for the following components:   Color, Urine STRAW (*)    Specific Gravity, Urine 1.001 (*)    All other components within normal limits  CBC  TROPONIN I (HIGH SENSITIVITY)  TROPONIN I (HIGH SENSITIVITY)    EKG None  Radiology DG Chest 2 View  Result Date: 03/09/2021 CLINICAL DATA:  Chest pain EXAM: CHEST - 2 VIEW COMPARISON:  03/02/2006 FINDINGS: The heart size and mediastinal contours are within normal limits. Both lungs are clear. Degenerative changes of the spine. IMPRESSION: No active cardiopulmonary disease. Electronically Signed   By: Donavan Foil M.D.   On: 03/09/2021 17:26    Procedures Procedures    Medications Ordered in ED Medications  sodium chloride 0.9 % bolus 1,000 mL (0 mLs Intravenous Stopped 03/09/21 1921)    ED Course/ Medical Decision Making/ A&P                           Medical Decision Making Amount and/or Complexity of Data Reviewed Labs: ordered. Radiology: ordered.  I discussed this case with my attending physician who cosigned this note including patient's presenting symptoms, physical exam, and planned diagnostics and interventions. Attending physician stated agreement with plan or made changes to plan which were implemented.   Attending physician assessed patient at bedside.  Given the large differential diagnosis for Marcelino Scot, the decision making in this case is of high complexity.  After evaluating all of the data points in this case, the presentation of ELERY FOULKES is NOT consistent with Acute Coronary Syndrome (ACS) and/or myocardial ischemia, pulmonary embolism, aortic dissection; Borhaave's, significant arrythmia, pneumothorax, cardiac tamponade, or other emergent cardiopulmonary condition.  Further, the presentation of DONAVYN BENNINK is NOT consistent with pericarditis, myocarditis, cholecystitis, pancreatitis, mediastinitis, endocarditis, new valvular disease.  Additionally, the  presentation of GREAT ISOBE is NOT consistent with flail chest, cardiac contusion, ARDS, or significant intra-thoracic or intra-abdominal bleeding.  Moreover, this presentation is NOT consistent with pneumonia, sepsis, or pyelonephritis.  In addition to the above with patient's description of cold feet bilaterally I have some concern for peripheral arterial disease, ischemic limb, versus peripheral neuropathy versus other.  This is not an exhaustive differential.  I reviewed lab work including BMP, CBC, urinalysis, delta troponins.  His lab work was significant for moderate hyponatremia at 126.  We will replete sodium with a fluid bolus of sodium chloride.  No sign of hypokalemia, very minimal hyperglycemia.  With no previous history  of diabetes, doubt that patient's feeling of cold feet is secondary to peripheral neuropathy.  His troponins were negative x2.  His CBC is unremarkable.  His urinalysis is unremarkable.  His urine is somewhat dilute, patient does endorse significant oral fluid rehydration.  Personally reviewed and interpreted radiographic imaging including chest x-ray which shows no active cardiopulmonary disease.  I agree with the radiologist interpretation.  His EKG shows an underlying rhythm of normal sinus, with an incomplete right bundle branch block.  My attending Dr. Vanita Panda independently interpreted and agrees with these findings.  While patient has no evidence of ACS or other emergent diagnoses at this time his description of exertional dyspnea, intermittent tachycardia and chest pain are moderately concerning for possible cardiac etiology.  He has a heart score of 4 so I believe he is stable for discharge at this time.  We strongly recommend that he follow-up in the coming days to weeks with cardiology for further evaluation, possible stress test.    Strict return and follow-up precautions have been given by me personally or by detailed written instruction given verbally  by nursing staff using the teach back method to the patient/family/caregiver(s).  Data Reviewed/Counseling: I have reviewed the patient's vital signs, nursing notes, and other relevant tests/information. I had a detailed discussion regarding the historical points, exam findings, and any diagnostic results supporting the discharge diagnosis. I also discussed the need for outpatient follow-up and the need to return to the ED if symptoms worsen or if there are any questions or concerns that arise at home.  Final Clinical Impression(s) / ED Diagnoses Final diagnoses:  Chest pain, unspecified type    Rx / DC Orders ED Discharge Orders     None         Dorien Chihuahua 03/09/21 2015    Carmin Muskrat, MD 03/09/21 2148

## 2021-03-30 ENCOUNTER — Encounter: Payer: BLUE CROSS/BLUE SHIELD | Admitting: Urology

## 2021-03-30 ENCOUNTER — Encounter: Payer: 59 | Admitting: Urology

## 2021-04-02 ENCOUNTER — Ambulatory Visit (INDEPENDENT_AMBULATORY_CARE_PROVIDER_SITE_OTHER): Payer: 59 | Admitting: Urology

## 2021-04-02 ENCOUNTER — Encounter: Payer: Self-pay | Admitting: Urology

## 2021-04-02 ENCOUNTER — Other Ambulatory Visit: Payer: Self-pay

## 2021-04-02 VITALS — BP 107/70 | HR 80 | Wt 277.0 lb

## 2021-04-02 DIAGNOSIS — Z302 Encounter for sterilization: Secondary | ICD-10-CM | POA: Diagnosis not present

## 2021-04-02 MED ORDER — HYDROCODONE-ACETAMINOPHEN 5-325 MG PO TABS: 1.0000 | ORAL_TABLET | Freq: Four times a day (QID) | ORAL | 0 refills | Status: DC | PRN

## 2021-04-02 MED ORDER — CEPHALEXIN 500 MG PO CAPS: 500.0000 mg | ORAL_CAPSULE | Freq: Three times a day (TID) | ORAL | 0 refills | Status: AC

## 2021-04-02 NOTE — Progress Notes (Signed)
Assessment: 1. Encounter for vasectomy     Plan: Post vasectomy instructions given Post vasectomy semen analysis in 12 weeks Rx for pain medication and antibiotics provided  Chief Complaint:  Chief Complaint  Patient presents with   VAS    History of Present Illness:  Billy Chung is a 61 y.o. year old male who is seen for vasectomy.  He is separated with 3 children.  He has a remote history of scrotal trauma.  No history of infection.   Past Medical History:  Past Medical History:  Diagnosis Date   Alcohol abuse    Pneumonia     Past Surgical History:  Past Surgical History:  Procedure Laterality Date   APPENDECTOMY     ORIF ANKLE FRACTURE Right 11/04/2015   Procedure: OPEN REDUCTION INTERNAL FIXATION (ORIF) ANKLE FRACTURE;  Surgeon: Nadara Mustard, MD;  Location: [facility] OR;  Service: Orthopedics;  Laterality: Right;    Allergies:  Allergies  Allergen Reactions   No Known Allergies     Family History:  No family history on file.  Social History:  Social History   Tobacco Use   Smoking status: Former   Smokeless tobacco: Current    Types: Chew  Substance Use Topics   Alcohol use: Yes    Alcohol/week: 168.0 standard drinks    Types: 168 Cans of beer per week    Comment: daily   Drug use: Not Currently    Types: Cocaine    Comment: former drug use (2 years ago)    ROS: Constitutional:  Negative for fever, chills, weight loss CV: Negative for chest pain, previous MI, hypertension Respiratory:  Negative for shortness of breath, wheezing, sleep apnea, frequent cough GI:  Negative for nausea, vomiting, bloody stool, GERD  Physical exam: BP 107/70    Pulse 80    Wt 277 lb (125.6 kg)    BMI 42.12 kg/m  GENERAL APPEARANCE:  Well appearing, well developed, well nourished, NAD HEENT:  Atraumatic, normocephalic, oropharynx clear NECK:  Supple without lymphadenopathy or thyromegaly ABDOMEN:  Soft, non-tender, no masses EXTREMITIES:  Moves all  extremities well, without clubbing, cyanosis, or edema NEUROLOGIC:  Alert and oriented x 3, normal gait, CN II-XII grossly intact MENTAL STATUS:  appropriate BACK:  Non-tender to palpation, No CVAT SKIN:  Warm, dry, and intact  Results: None  VASECTOMY PROCEDURE:  Billy Chung presents for vasectomy following previous vasectomy consultation and permit is signed.  The patient's anterior scrotal wall is shaved and prepped with Betadine in standard sterile fashion.  1% lidocaine is used as local anesthetic in the scrotal and peri vasal tissue.  A standard median raphe punch incision is made and a no scalpel technique vasectomy is performed.  Bilateral vas are isolated from the peri vasal tissue and an approximately 1 cm segment of vas is excised.  Proximal and distal segments are internally cauterized with electric heat cautery. Interposition of perivasal tissue was performed.  Bilateral palpation confirms bilateral vasectomy defect and no significant bleeding or hematoma is identified.  Neosporin gauze dressing and a scrotal support are applied.    Disposition: Patient is discharged home with Rx for pain medication and antibiotics.  Patient is given routine vasectomy instructions.   Most importantly, he is instructed and cautioned again regarding the need for protected intercourse until such time that a single  negative semen analysis has been obtained.  The initial semen analysis will be checked in approximately 12  weeks.  The patient reports a  clear understanding.  He will call with any interval questions or concerns.

## 2021-04-02 NOTE — Patient Instructions (Signed)
Vasectomy Postoperative Instructions  Please bring back a semen analysis in approximately 3 months.  Your semen analysis  will need to be taken to  [facility] [address] [place],  [address] There phone number is [phone]. Please call and schedule an appointment before taking your specimen.    You will be given a sterile specimen cup. Please label the cup with your name, date of birth, date and time of collections.  What to Expect  - slight redness, swelling and scant drainage along the incision  - mild to moderate discomfort  - black and blue (bruising) as the tissue heals  - low grade fever  - scrotal sensitivity and/or tenderness - Edges of the incision may pull apart and heal slowly, sometimes a knot may be present which remains for several months.  This is NORMAL and all part of the healing process. - if stitches are placed, they do not need to be removed - if you have pain or discomfort immediately after the vasectomy, you may use OTC pain medication for relief , ex: tylenol.  After local anesthetic wears off an ice pack will provide additional comfort and can also prevent swelling if used  Activity  - no sexual intercourse for at lease 5 days depending on comfort  - no heavy lifting for 48-72 hours (anything over 5-10 lbs)  Wound Care  - shower only after 24 hours  - no tub baths, hot tub, or pools for at least 7 days  - ice packs for 48 hours: 30 minutes on and 30 minutes off  Problem to Report  - generalized redness  - increased pain and swelling  - fever greater than 101 F  - significant drainage or bleeding from the wound  TO DO - Ejaculations help to clear the passage of sperm, but you must use another from of birth control until you are told you may discontinue its use!! - You will be given a specimen cup to bring back a semen sample in 3 months to check and see if its clear of sperm.  Only after the semen is sent for analysis and is reported back as clear  should you use this as your primary form of birth control!   

## 2021-05-04 ENCOUNTER — Other Ambulatory Visit (HOSPITAL_COMMUNITY): Payer: Self-pay | Admitting: Nurse Practitioner

## 2021-05-04 DIAGNOSIS — F1029 Alcohol dependence with unspecified alcohol-induced disorder: Secondary | ICD-10-CM

## 2021-05-05 ENCOUNTER — Other Ambulatory Visit: Payer: Self-pay

## 2021-05-05 ENCOUNTER — Other Ambulatory Visit (HOSPITAL_COMMUNITY)
Admission: RE | Admit: 2021-05-05 | Discharge: 2021-05-05 | Disposition: A | Discharge status: Home / Self Care | Payer: 59 | Source: Ambulatory Visit | Attending: Internal Medicine | Admitting: Internal Medicine

## 2021-05-05 ENCOUNTER — Encounter: Payer: Self-pay | Admitting: Internal Medicine

## 2021-05-05 ENCOUNTER — Ambulatory Visit (INDEPENDENT_AMBULATORY_CARE_PROVIDER_SITE_OTHER): Payer: 59 | Admitting: Internal Medicine

## 2021-05-05 VITALS — BP 120/80 | HR 68 | Ht 68.0 in | Wt 267.0 lb

## 2021-05-05 DIAGNOSIS — Z131 Encounter for screening for diabetes mellitus: Secondary | ICD-10-CM

## 2021-05-05 DIAGNOSIS — Z13220 Encounter for screening for lipoid disorders: Secondary | ICD-10-CM

## 2021-05-05 DIAGNOSIS — R0789 Other chest pain: Secondary | ICD-10-CM | POA: Diagnosis present

## 2021-05-05 LAB — LIPID PANEL
Cholesterol: 177 mg/dL (ref 0–200)
HDL: 64 mg/dL
LDL Cholesterol: 97 mg/dL (ref 0–99)
Total CHOL/HDL Ratio: 2.8 ratio
Triglycerides: 79 mg/dL
VLDL: 16 mg/dL (ref 0–40)

## 2021-05-05 LAB — COMPREHENSIVE METABOLIC PANEL
ALT: 40 U/L (ref 0–44)
AST: 76 U/L — ABNORMAL HIGH (ref 15–41)
Albumin: 3.7 g/dL (ref 3.5–5.0)
Alkaline Phosphatase: 134 U/L — ABNORMAL HIGH (ref 38–126)
Anion gap: 12 (ref 5–15)
BUN: <5 mg/dL — ABNORMAL LOW (ref 8–23)
CO2: 22 mmol/L (ref 22–32)
Calcium: 8.6 mg/dL — ABNORMAL LOW (ref 8.9–10.3)
Chloride: 99 mmol/L (ref 98–111)
Creatinine, Ser: 0.59 mg/dL — ABNORMAL LOW (ref 0.61–1.24)
GFR, Estimated: >60 mL/min (ref >60)
Glucose, Bld: 98 mg/dL (ref 70–99)
Potassium: 4 mmol/L (ref 3.5–5.1)
Sodium: 133 mmol/L — ABNORMAL LOW (ref 135–145)
Total Bilirubin: 0.9 mg/dL (ref 0.3–1.2)
Total Protein: 7.9 g/dL (ref 6.5–8.1)

## 2021-05-05 LAB — HEMOGLOBIN A1C
Hgb A1c MFr Bld: 5.4 % (ref 4.8–5.6)
Mean Plasma Glucose: 108.28 mg/dL

## 2021-05-05 NOTE — Patient Instructions (Signed)
Medication Instructions:  ?Your physician recommends that you continue on your current medications as directed. Please refer to the Current Medication list given to you today. ? ?*If you need a refill on your cardiac medications before your next appointment, please call your pharmacy* ? ? ?Lab Work: ?Your physician recommends that you return for lab work in: Today  ? ?If you have labs (blood work) drawn today and your tests are completely normal, you will receive your results only by: ?MyChart Message (if you have MyChart) OR ?A paper copy in the mail ?If you have any lab test that is abnormal or we need to change your treatment, we will call you to review the results. ? ? ?Testing/Procedures: ?Your physician has requested that you have an echocardiogram. Echocardiography is a painless test that uses sound waves to create images of your heart. It provides your doctor with information about the size and shape of your heart and how well your heart?s chambers and valves are working. This procedure takes approximately one hour. There are no restrictions for this procedure. ? ? ? ?Follow-Up: ?At [facility], you and your health needs are our priority.  As part of our continuing mission to provide you with exceptional heart care, we have created designated Provider Care Teams.  These Care Teams include your primary Cardiologist (physician) and Advanced Practice Providers (APPs -  Physician Assistants and Nurse Practitioners) who all work together to provide you with the care you need, when you need it. ? ?We recommend signing up for the patient portal called "MyChart".  Sign up information is provided on this After Visit Summary.  MyChart is used to connect with patients for Virtual Visits (Telemedicine).  Patients are able to view lab/test results, encounter notes, upcoming appointments, etc.  Non-urgent messages can be sent to your provider as well.   ?To learn more about what you can do with MyChart, go to  [email or web address].   ? ?Your next appointment:   ? To Be Determined  ? ?The format for your next appointment:   ?In Person ? ?Provider:   ?Dietrich Pates, MD  ? ? ?Other Instructions ?Thank you for choosing [facility]! ?  ? ?

## 2021-05-05 NOTE — Progress Notes (Signed)
? ?Cardiology Office Note ? ? ?Date:  05/05/2021  ? ?ID:  Billy Chung, DOB [birth year], MRN [id number] ? ?PCP:  Gareth Morgan, MD  ?Cardiologist:   Dietrich Pates, MD  ? ?Patient referred for CP  ?  ?History of Present Illness: ?Billy Chung is a 61 y.o. male with a history of chest pain   ?Seen in ED on Jan  2023    ?The pt says he gets tightness in his chest at night   When in bed  Symptoms started in Nov   He says it got worse when his wife left him in December  Still loves her    Wakes up with his heart pounding    ?Denies tightness with physical activity   Does get winded   has ankle problems     ? ?Drinks about 1 case beer per day    Quit 5 years ago  Didn't feel any better   Went back   ? ?Has sleep apnea  Does not use cPAP  ? ? ? ? ?No outpatient medications have been marked as taking for the 05/05/21 encounter (Office Visit) with Pricilla Riffle, MD.  ? ? ? ?Allergies:   No known allergies  ? ?Past Medical History:  ?Diagnosis Date  ? Alcohol abuse   ? Pneumonia   ? ? ?Past Surgical History:  ?Procedure Laterality Date  ? APPENDECTOMY    ? ORIF ANKLE FRACTURE Right 11/04/2015  ? Procedure: OPEN REDUCTION INTERNAL FIXATION (ORIF) ANKLE FRACTURE;  Surgeon: Nadara Mustard, MD;  Location: [facility] OR;  Service: Orthopedics;  Laterality: Right;  ? ? ? ?Social History:  The patient  reports that he has quit smoking. His smokeless tobacco use includes chew. He reports current alcohol use of about 168.0 standard drinks per week. He reports that he does not currently use drugs after having used the following drugs: Cocaine.  ? ?Family History:  The patient's family history is not on file.  ? ? ?ROS:  Please see the history of present illness. All other systems are reviewed and  Negative to the above problem except as noted.  ? ? ?PHYSICAL EXAM: ?VS:  BP 120/80   Pulse 68   Ht 5\' 8"  (1.727 m)   Wt 267 lb (121.1 kg)   SpO2 96%   BMI 40.60 kg/m?   ?GEN: Well nourished, well developed, in no acute distress  ?HEENT:  normal  ?Neck: no JVD, carotid bruits, ?Cardiac: RRR; no murmurs  No LE  edema  ?Respiratory:  clear to auscultation bilaterally ?GI: soft, nontender, nondistended, + BS    ?MS: no deformity Moving all extremities   ?Skin: warm and dry, no rash ?Neuro:  Strength and sensation are intact ?Psych: euthymic mood, full affect ? ? ?EKG:  EKG is not ordered today. ? ? ?Lipid Panel ?No results found for: CHOL, TRIG, HDL, CHOLHDL, VLDL, LDLCALC, LDLDIRECT ?  ? ?Wt Readings from Last 3 Encounters:  ?05/05/21 267 lb (121.1 kg)  ?04/02/21 277 lb (125.6 kg)  ?03/09/21 277 lb 3.2 oz (125.7 kg)  ?  ? ? ?ASSESSMENT AND PLAN: ? 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 ?2  EtOH   Counselled on cessation ? ?3  Morbid obesity   Counselled on diet , cut back/quit EtoH ? ? ? ?  Labs  Check   CMET, A1C Lipids    ? ? ?Current medicines are reviewed at length with the patient today.  The patient does not have concerns regarding medicines. ? ?Signed, ?Dietrich Pates, MD  ?05/05/2021 10:55 AM    ?[facility] ?[address] [place], [place], [place]  [address] ?Phone: [phone]; Fax: [phone]  ? ? ?

## 2021-05-11 ENCOUNTER — Ambulatory Visit (INDEPENDENT_AMBULATORY_CARE_PROVIDER_SITE_OTHER): Payer: 59 | Admitting: Urology

## 2021-05-11 ENCOUNTER — Ambulatory Visit (HOSPITAL_COMMUNITY)
Admission: RE | Admit: 2021-05-11 | Discharge: 2021-05-11 | Disposition: A | Discharge status: Home / Self Care | Payer: 59 | Source: Ambulatory Visit | Attending: Nurse Practitioner | Admitting: Nurse Practitioner

## 2021-05-11 ENCOUNTER — Encounter: Payer: Self-pay | Admitting: Urology

## 2021-05-11 ENCOUNTER — Other Ambulatory Visit: Payer: Self-pay

## 2021-05-11 VITALS — BP 108/64 | HR 79

## 2021-05-11 DIAGNOSIS — F1029 Alcohol dependence with unspecified alcohol-induced disorder: Secondary | ICD-10-CM | POA: Diagnosis present

## 2021-05-11 DIAGNOSIS — N5082 Scrotal pain: Secondary | ICD-10-CM

## 2021-05-11 MED ORDER — MELOXICAM 7.5 MG PO TABS: 7.5000 mg | ORAL_TABLET | Freq: Every day | ORAL | 1 refills | Status: DC

## 2021-05-11 MED ORDER — DOXYCYCLINE HYCLATE 100 MG PO CAPS: 100.0000 mg | ORAL_CAPSULE | Freq: Two times a day (BID) | ORAL | 0 refills | Status: DC

## 2021-05-11 NOTE — Progress Notes (Signed)
? ?  Assessment: ?1. Scrotal pain   ? ?Plan: ?I reassured him that the findings on exam are not unusual after a vasectomy. ?Given his discomfort, I recommended treatment with antibiotics and an anti-inflammatory. ?Prescription for doxycycline x10 days and meloxicam x14 days sent to his pharmacy. ?Return to office as needed. ? ? ?Chief Complaint:  ?Chief Complaint  ?Patient presents with  ? Testicle Pain  ? ? ?History of Present Illness: ? ?Billy Chung is a 61 y.o. year old male who is seen for scrotal pain.  He is s/p vasectomy on 04/02/21.  He is having bilateral discomfort in the posterior scrotum.  He is also noted small knots bilaterally.  His incision has healed well.  No urinary symptoms. ? ?Past Medical History:  ?Past Medical History:  ?Diagnosis Date  ? Alcohol abuse   ? Pneumonia   ? ? ?Past Surgical History:  ?Past Surgical History:  ?Procedure Laterality Date  ? APPENDECTOMY    ? ORIF ANKLE FRACTURE Right 11/04/2015  ? Procedure: OPEN REDUCTION INTERNAL FIXATION (ORIF) ANKLE FRACTURE;  Surgeon: Nadara Mustard, MD;  Location: [facility] OR;  Service: Orthopedics;  Laterality: Right;  ? ? ?Allergies:  ?Allergies  ?Allergen Reactions  ? No Known Allergies   ? ? ?Family History:  ?No family history on file. ? ?Social History:  ?Social History  ? ?Tobacco Use  ? Smoking status: Former  ? Smokeless tobacco: Current  ?  Types: Chew  ?Vaping Use  ? Vaping Use: Never used  ?Substance Use Topics  ? Alcohol use: Yes  ?  Alcohol/week: 168.0 standard drinks  ?  Types: 168 Cans of beer per week  ?  Comment: daily  ? Drug use: Not Currently  ?  Types: Cocaine  ?  Comment: former drug use (2 years ago)  ? ? ?ROS: ?Constitutional:  Negative for fever, chills, weight loss ?CV: Negative for chest pain, previous MI, hypertension ?Respiratory:  Negative for shortness of breath, wheezing, sleep apnea, frequent cough ?GI:  Negative for nausea, vomiting, bloody stool, GERD ? ?Physical exam: ?BP 108/64   Pulse 79  ?GENERAL  APPEARANCE:  Well appearing, well developed, well nourished, NAD ?HEENT:  Atraumatic, normocephalic, oropharynx clear ?NECK:  Supple without lymphadenopathy or thyromegaly ?ABDOMEN:  Soft, non-tender, no masses ?EXTREMITIES:  Moves all extremities well, without clubbing, cyanosis, or edema ?NEUROLOGIC:  Alert and oriented x 3, normal gait, CN II-XII grossly intact ?MENTAL STATUS:  appropriate ?BACK:  Non-tender to palpation, No CVAT ?SKIN:  Warm, dry, and intact ?GU: ?Scrotum: No significant edema or erythema; vas site palpated bilaterally with some associated tenderness ?Testis: normal without masses bilateral ?Epididymis: normal ? ?Results: ?U/A:  dipstick negative ? ? ?

## 2021-05-12 LAB — URINALYSIS, ROUTINE W REFLEX MICROSCOPIC
Bilirubin, UA: NEGATIVE
Glucose, UA: NEGATIVE
Ketones, UA: NEGATIVE
Leukocytes,UA: NEGATIVE
Nitrite, UA: NEGATIVE
Protein,UA: NEGATIVE
RBC, UA: NEGATIVE
Specific Gravity, UA: <1.005 — ABNORMAL LOW (ref 1.005–1.030)
Urobilinogen, Ur: 0.2 mg/dL (ref 0.2–1.0)
pH, UA: 6 (ref 5.0–7.5)

## 2021-05-20 ENCOUNTER — Ambulatory Visit (HOSPITAL_COMMUNITY)
Admission: RE | Admit: 2021-05-20 | Discharge: 2021-05-20 | Disposition: A | Discharge status: Home / Self Care | Payer: 59 | Source: Ambulatory Visit | Attending: Internal Medicine | Admitting: Internal Medicine

## 2021-05-20 DIAGNOSIS — R0789 Other chest pain: Secondary | ICD-10-CM | POA: Insufficient documentation

## 2021-05-20 LAB — ECHOCARDIOGRAM COMPLETE
AR max vel: 2.81 cm2
AV Area VTI: 2.59 cm2
AV Area mean vel: 2.41 cm2
AV Mean grad: 4 mmHg
AV Peak grad: 7 mmHg
Ao pk vel: 1.32 m/s
Area-P 1/2: 2.63 cm2
Calc EF: 61.3 %
MV VTI: 2.57 cm2
S' Lateral: 3.2 cm
Single Plane A2C EF: 61.6 %
Single Plane A4C EF: 62.1 %

## 2021-05-20 NOTE — Progress Notes (Signed)
*  PRELIMINARY RESULTS* ?Echocardiogram ?2D Echocardiogram has been performed. ? ?Billy Chung ?05/20/2021, 3:57 PM ?

## 2021-11-10 ENCOUNTER — Ambulatory Visit: Payer: Self-pay

## 2021-11-10 ENCOUNTER — Ambulatory Visit (INDEPENDENT_AMBULATORY_CARE_PROVIDER_SITE_OTHER): Payer: PRIVATE HEALTH INSURANCE | Admitting: Family

## 2021-11-10 ENCOUNTER — Ambulatory Visit (INDEPENDENT_AMBULATORY_CARE_PROVIDER_SITE_OTHER): Payer: PRIVATE HEALTH INSURANCE

## 2021-11-10 DIAGNOSIS — G8929 Other chronic pain: Secondary | ICD-10-CM

## 2021-11-10 DIAGNOSIS — M14672 Charcot's joint, left ankle and foot: Secondary | ICD-10-CM

## 2021-11-10 DIAGNOSIS — M25572 Pain in left ankle and joints of left foot: Secondary | ICD-10-CM | POA: Diagnosis not present

## 2021-11-10 DIAGNOSIS — M25872 Other specified joint disorders, left ankle and foot: Secondary | ICD-10-CM | POA: Diagnosis not present

## 2021-11-10 DIAGNOSIS — M25571 Pain in right ankle and joints of right foot: Secondary | ICD-10-CM

## 2021-12-15 ENCOUNTER — Encounter: Payer: Self-pay | Admitting: Family

## 2021-12-15 NOTE — Progress Notes (Signed)
Office Visit Note   Patient: Billy Chung           Date of Birth: Jun 29, 1960           MRN: [id number] Visit Date: 11/10/2021              Requested by: Gareth Morgan, MD [address] [place],  [place] [address] PCP: Gareth Morgan, MD  Chief Complaint  Patient presents with   Left Ankle - Pain   Right Ankle - Pain    Hx ORIF ankle fx 2017      HPI: The patient is a 61 year old gentleman seen for evaluation of bilateral ankle pain right worse than left.  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  He did have Weber C fibular fracture with ORIF subsequent traumatic arthritis on the right.  For the right ankle he has been having significant pain and is requesting a total ankle replacement.  He has been having pain with all weightbearing especially over the anterior ankle  He has not Been considered a good candidate for ankle replacement in the past would like repeat injection.    Today his left ankle is bothering him significantly as well having similar symptoms anterior joint line pain pain with weightbearing shooting burning  Denies recent injury, warmth or change to foot and ankle  Assessment & Plan: Visit Diagnoses:  1. Chronic pain of both ankles   2. Charcot's joint, left ankle and foot   3. Impingement syndrome of left ankle     Plan: Bilateral Depo-Medrol injection bilateral ankles patient tolerated well.  Again discussed Dr. Audrie Lia recommendations for ankle fusion on the right over ankle replacement patient would like a second opinion would like to be considered for ankle replacement on the right.  Follow-Up Instructions: Return if symptoms worsen or fail to improve.   Ortho Exam  Patient is alert, oriented, no adenopathy, well-dressed, normal affect, normal respiratory effort. On examination the patient has an antalgic gait.  He does have palpable pulses bilateral dorsalis pedis. Significant swelling to the right  lower extremity over the left into the ankle and foot.  No cellulitis no erythema. No active charcot process. He is tender to palpation across the anterior joint line bilaterally.  Imaging: No results found. No images are attached to the encounter.  Labs: Lab Results  Component Value Date   HGBA1C 5.4 05/05/2021     Lab Results  Component Value Date   ALBUMIN 3.7 05/05/2021   ALBUMIN 3.4 (L) 11/04/2015    No results found for: "MG" No results found for: "VD25OH"  No results found for: "PREALBUMIN"    Latest Ref Rng & Units 03/09/2021    4:50 PM 11/04/2015    9:56 AM 02/09/2011   11:28 PM  CBC EXTENDED  WBC 4.0 - 10.5 K/uL 9.1  7.4  5.8   RBC 4.22 - 5.81 MIL/uL 4.24  4.55  4.73   Hemoglobin 13.0 - 17.0 g/dL 84.6  96.2  95.2   HCT 39.0 - 52.0 % 41.5  44.7  45.3   Platelets 150 - 400 K/uL 170  234  256      There is no height or weight on file to calculate BMI.  Orders:  Orders Placed This Encounter  Procedures   Medium Joint Inj: bilateral ankle   XR Ankle Complete Right   XR Ankle Complete Left   No orders of the defined types were  placed in this encounter.    Procedures: Medium Joint Inj: bilateral ankle on 12/24/2021 8:52 AM Indications: pain Details: 25 G needle, anterolateral approach Outcome: tolerated well, no immediate complications Consent was given by the patient. Patient was prepped and draped in the usual sterile fashion.      Clinical Data: No additional findings.  ROS:  All other systems negative, except as noted in the HPI. Review of Systems  Objective: Vital Signs: There were no vitals taken for this visit.  Specialty Comments:  No specialty comments available.  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 No pertinent family history.  Past Surgical History:  Procedure Laterality Date   APPENDECTOMY     ORIF ANKLE FRACTURE Right 11/04/2015   Procedure: OPEN REDUCTION INTERNAL FIXATION (ORIF) ANKLE FRACTURE;  Surgeon: Newt Minion, MD;  Location: [facility];  Service: Orthopedics;  Laterality: Right;   Social History   Occupational History   Not on file  Tobacco Use   Smoking status: Former   Smokeless tobacco: Current    Types: Chew  Vaping Use   Vaping Use: Never used  Substance and Sexual Activity   Alcohol use: Yes    Alcohol/week: 168.0 standard drinks of alcohol    Types: 168 Cans of beer per week    Comment: daily   Drug use: Not Currently    Types: Cocaine    Comment: former drug use (2 years ago)   Sexual activity: Not on file

## 2021-12-24 DIAGNOSIS — G8929 Other chronic pain: Secondary | ICD-10-CM | POA: Diagnosis not present

## 2021-12-24 DIAGNOSIS — M25572 Pain in left ankle and joints of left foot: Secondary | ICD-10-CM

## 2021-12-24 DIAGNOSIS — M25872 Other specified joint disorders, left ankle and foot: Secondary | ICD-10-CM | POA: Diagnosis not present

## 2021-12-24 DIAGNOSIS — M25571 Pain in right ankle and joints of right foot: Secondary | ICD-10-CM | POA: Diagnosis not present

## 2022-12-03 ENCOUNTER — Emergency Department (HOSPITAL_COMMUNITY): Payer: No Typology Code available for payment source

## 2022-12-03 ENCOUNTER — Encounter (HOSPITAL_COMMUNITY): Payer: Self-pay

## 2022-12-03 ENCOUNTER — Other Ambulatory Visit: Payer: Self-pay

## 2022-12-03 ENCOUNTER — Emergency Department (HOSPITAL_COMMUNITY)
Admission: EM | Admit: 2022-12-03 | Discharge: 2022-12-03 | Disposition: A | Discharge status: Home / Self Care | Payer: No Typology Code available for payment source | Attending: Emergency Medicine | Admitting: Emergency Medicine

## 2022-12-03 DIAGNOSIS — R600 Localized edema: Secondary | ICD-10-CM | POA: Diagnosis not present

## 2022-12-03 DIAGNOSIS — R1907 Generalized intra-abdominal and pelvic swelling, mass and lump: Secondary | ICD-10-CM | POA: Diagnosis present

## 2022-12-03 DIAGNOSIS — R0602 Shortness of breath: Secondary | ICD-10-CM | POA: Diagnosis not present

## 2022-12-03 DIAGNOSIS — K7031 Alcoholic cirrhosis of liver with ascites: Secondary | ICD-10-CM | POA: Diagnosis not present

## 2022-12-03 LAB — URINALYSIS, ROUTINE W REFLEX MICROSCOPIC
Glucose, UA: NEGATIVE mg/dL
Hgb urine dipstick: NEGATIVE
Ketones, ur: NEGATIVE mg/dL
Leukocytes,Ua: NEGATIVE
Nitrite: NEGATIVE
Protein, ur: 30 mg/dL — AB
Specific Gravity, Urine: 1.025 (ref 1.005–1.030)
pH: 5 (ref 5.0–8.0)

## 2022-12-03 LAB — ETHANOL: Alcohol, Ethyl (B): <10 mg/dL (ref <10)

## 2022-12-03 LAB — CBC WITH DIFFERENTIAL/PLATELET
Abs Immature Granulocytes: 0.02 10*3/uL (ref 0.00–0.07)
Basophils Absolute: 0.1 10*3/uL (ref 0.0–0.1)
Basophils Relative: 1 %
Eosinophils Absolute: 0.1 10*3/uL (ref 0.0–0.5)
Eosinophils Relative: 1 %
HCT: 35.2 % — ABNORMAL LOW (ref 39.0–52.0)
Hemoglobin: 12.3 g/dL — ABNORMAL LOW (ref 13.0–17.0)
Immature Granulocytes: 0 %
Lymphocytes Relative: 20 %
Lymphs Abs: 1.2 10*3/uL (ref 0.7–4.0)
MCH: 36 pg — ABNORMAL HIGH (ref 26.0–34.0)
MCHC: 34.9 g/dL (ref 30.0–36.0)
MCV: 102.9 fL — ABNORMAL HIGH (ref 80.0–100.0)
Monocytes Absolute: 1 10*3/uL (ref 0.1–1.0)
Monocytes Relative: 16 %
Neutro Abs: 3.8 10*3/uL (ref 1.7–7.7)
Neutrophils Relative %: 62 %
Platelets: 113 10*3/uL — ABNORMAL LOW (ref 150–400)
RBC: 3.42 MIL/uL — ABNORMAL LOW (ref 4.22–5.81)
RDW: 15.9 % — ABNORMAL HIGH (ref 11.5–15.5)
WBC: 6 10*3/uL (ref 4.0–10.5)
nRBC: 0 % (ref 0.0–0.2)

## 2022-12-03 LAB — COMPREHENSIVE METABOLIC PANEL
ALT: 28 U/L (ref 0–44)
AST: 57 U/L — ABNORMAL HIGH (ref 15–41)
Albumin: 2.2 g/dL — ABNORMAL LOW (ref 3.5–5.0)
Alkaline Phosphatase: 106 U/L (ref 38–126)
Anion gap: 7 (ref 5–15)
BUN: 9 mg/dL (ref 8–23)
CO2: 23 mmol/L (ref 22–32)
Calcium: 7.9 mg/dL — ABNORMAL LOW (ref 8.9–10.3)
Chloride: 99 mmol/L (ref 98–111)
Creatinine, Ser: 0.69 mg/dL (ref 0.61–1.24)
GFR, Estimated: >60 mL/min (ref >60)
Glucose, Bld: 87 mg/dL (ref 70–99)
Potassium: 3.4 mmol/L — ABNORMAL LOW (ref 3.5–5.1)
Sodium: 129 mmol/L — ABNORMAL LOW (ref 135–145)
Total Bilirubin: 6.3 mg/dL — ABNORMAL HIGH (ref 0.3–1.2)
Total Protein: 6.6 g/dL (ref 6.5–8.1)

## 2022-12-03 LAB — BRAIN NATRIURETIC PEPTIDE: B Natriuretic Peptide: 62 pg/mL (ref 0.0–100.0)

## 2022-12-03 LAB — TROPONIN I (HIGH SENSITIVITY)
Troponin I (High Sensitivity): 4 ng/L (ref <18)
Troponin I (High Sensitivity): 3 ng/L (ref <18)

## 2022-12-03 MED ORDER — FUROSEMIDE 20 MG PO TABS: 20.0000 mg | ORAL_TABLET | Freq: Every day | ORAL | 0 refills | Status: DC

## 2022-12-03 MED ORDER — ALBUTEROL SULFATE HFA 108 (90 BASE) MCG/ACT IN AERS: 2.0000 | INHALATION_SPRAY | RESPIRATORY_TRACT | Status: DC | PRN

## 2022-12-03 MED ORDER — IOHEXOL 300 MG/ML  SOLN
100.0000 mL | Freq: Once | INTRAMUSCULAR | Status: AC | PRN
  Administered 2022-12-03: 100 mL via INTRAVENOUS

## 2022-12-03 NOTE — ED Notes (Signed)
Patient transported to X-ray 

## 2022-12-03 NOTE — ED Triage Notes (Signed)
Pt comes in with SOB, abdominal distention, decreased urine output, and has quit drinking alcohol over a case a day to completely stopping over 7 days ago.

## 2022-12-03 NOTE — ED Provider Notes (Incomplete Revision)
[place] EMERGENCY DEPARTMENT AT [facility] Provider Note   CSN: [id number] Arrival date & time: 12/03/22  1122     History  Chief Complaint  Patient presents with   Shortness of Breath    Billy Chung is a 62 y.o. male.  Patient to ED at the insistence of his ex-wife for evaluation of progressively worsening bilateral LE swelling and abdominal swelling. Patient states he is a heavy drinker, up to a case of beer daily, but started having vomiting one week ago anytime he tried to drink. He has not had any alcohol for 7 days. He reports going through a period of having tremors but they have resolved. No seizure. No vomiting except when trying to drink alcohol. Per his former spouse, he is not eating and hasn't for several days. He reports he is drinking lots of water. No abdominal pain. No SOB or chest pain. He states he is having difficulty urinating describing having the need to urinate but only producing a few drops.   The history is provided by the patient and a friend. No language interpreter was used.  Shortness of Breath      Home Medications Prior to Admission medications   Medication Sig Start Date End Date Taking? Authorizing Provider  furosemide (LASIX) 20 MG tablet Take 1 tablet (20 mg total) by mouth daily. 12/03/22  Yes Elpidio Anis, PA-C  ALPRAZolam Prudy Feeler) 1 MG tablet Take 1 tablet by mouth 1 hour prior to procedure Patient not taking: Reported on 05/05/2021 02/24/21   Milderd Meager., MD  doxycycline (VIBRAMYCIN) 100 MG capsule Take 1 capsule (100 mg total) by mouth every 12 (twelve) hours. 05/11/21   Stoneking, Danford Bad., MD  HYDROcodone-acetaminophen (NORCO/VICODIN) 5-325 MG tablet Take 1 tablet by mouth every 6 (six) hours as needed for moderate pain. Patient not taking: Reported on 05/05/2021 04/02/21   Stoneking, Danford Bad., MD  meloxicam (MOBIC) 7.5 MG tablet Take 1 tablet (7.5 mg total) by mouth daily. 05/11/21   Stoneking, Danford Bad., MD       Allergies    No known allergies    Review of Systems   Review of Systems  Respiratory:  Positive for shortness of breath.     Physical Exam Updated Vital Signs BP 118/73 (BP Location: Right Arm)   Pulse 77   Temp 97.9 F (36.6 C) (Oral)   Resp 16   Ht 5\' 8"  (1.727 m)   Wt 121.1 kg   SpO2 100%   BMI 40.59 kg/m  Physical Exam Vitals reviewed.  Constitutional:      Appearance: He is well-developed. He is obese.  HENT:     Mouth/Throat:     Mouth: Mucous membranes are moist.  Cardiovascular:     Rate and Rhythm: Normal rate and regular rhythm.  Pulmonary:     Effort: Pulmonary effort is normal.     Breath sounds: Normal breath sounds. No wheezing.  Abdominal:     Comments: Protuberant abdomen with edema. No fluid wave. Nontender.   Musculoskeletal:     Cervical back: Normal range of motion.     Right lower leg: Edema present.     Left lower leg: Edema present.  Skin:    General: Skin is warm and dry.     Findings: No erythema.  Neurological:     Mental Status: He is alert and oriented to person, place, and time.     ED Results / Procedures / Treatments   Labs (all  labs ordered are listed, but only abnormal results are displayed) Labs Reviewed  CBC WITH DIFFERENTIAL/PLATELET - Abnormal; Notable for the following components:      Result Value   RBC 3.42 (*)    Hemoglobin 12.3 (*)    HCT 35.2 (*)    MCV 102.9 (*)    MCH 36.0 (*)    RDW 15.9 (*)    Platelets 113 (*)    All other components within normal limits  COMPREHENSIVE METABOLIC PANEL - Abnormal; Notable for the following components:   Sodium 129 (*)    Potassium 3.4 (*)    Calcium 7.9 (*)    Albumin 2.2 (*)    AST 57 (*)    Total Bilirubin 6.3 (*)    All other components within normal limits  URINALYSIS, ROUTINE W REFLEX MICROSCOPIC - Abnormal; Notable for the following components:   Color, Urine AMBER (*)    APPearance HAZY (*)    Bilirubin Urine SMALL (*)    Protein, ur 30 (*)     Bacteria, UA FEW (*)    All other components within normal limits  BRAIN NATRIURETIC PEPTIDE  ETHANOL  TROPONIN I (HIGH SENSITIVITY)  TROPONIN I (HIGH SENSITIVITY)   Results for orders placed or performed during the hospital encounter of 12/03/22 (from the past 24 hour(s))  CBC with Differential     Status: Abnormal   Collection Time: 12/03/22  1:16 PM  Result Value Ref Range   WBC 6.0 4.0 - 10.5 K/uL   RBC 3.42 (L) 4.22 - 5.81 MIL/uL   Hemoglobin 12.3 (L) 13.0 - 17.0 g/dL   HCT 16.1 (L) 09.6 - 04.5 %   MCV 102.9 (H) 80.0 - 100.0 fL   MCH 36.0 (H) 26.0 - 34.0 pg   MCHC 34.9 30.0 - 36.0 g/dL   RDW 40.9 (H) 81.1 - 91.4 %   Platelets 113 (L) 150 - 400 K/uL   nRBC 0.0 0.0 - 0.2 %   Neutrophils Relative % 62 %   Neutro Abs 3.8 1.7 - 7.7 K/uL   Lymphocytes Relative 20 %   Lymphs Abs 1.2 0.7 - 4.0 K/uL   Monocytes Relative 16 %   Monocytes Absolute 1.0 0.1 - 1.0 K/uL   Eosinophils Relative 1 %   Eosinophils Absolute 0.1 0.0 - 0.5 K/uL   Basophils Relative 1 %   Basophils Absolute 0.1 0.0 - 0.1 K/uL   Immature Granulocytes 0 %   Abs Immature Granulocytes 0.02 0.00 - 0.07 K/uL  Brain natriuretic peptide     Status: None   Collection Time: 12/03/22  1:16 PM  Result Value Ref Range   B Natriuretic Peptide 62.0 0.0 - 100.0 pg/mL  Comprehensive metabolic panel     Status: Abnormal   Collection Time: 12/03/22  1:16 PM  Result Value Ref Range   Sodium 129 (L) 135 - 145 mmol/L   Potassium 3.4 (L) 3.5 - 5.1 mmol/L   Chloride 99 98 - 111 mmol/L   CO2 23 22 - 32 mmol/L   Glucose, Bld 87 70 - 99 mg/dL   BUN 9 8 - 23 mg/dL   Creatinine, Ser 7.82 0.61 - 1.24 mg/dL   Calcium 7.9 (L) 8.9 - 10.3 mg/dL   Total Protein 6.6 6.5 - 8.1 g/dL   Albumin 2.2 (L) 3.5 - 5.0 g/dL   AST 57 (H) 15 - 41 U/L   ALT 28 0 - 44 U/L   Alkaline Phosphatase 106 38 - 126 U/L  Total Bilirubin 6.3 (H) 0.3 - 1.2 mg/dL   GFR, Estimated >16 >10 mL/min   Anion gap 7 5 - 15  Ethanol     Status: None   Collection  Time: 12/03/22  1:16 PM  Result Value Ref Range   Alcohol, Ethyl (B) <10 <10 mg/dL  Troponin I (High Sensitivity)     Status: None   Collection Time: 12/03/22  1:16 PM  Result Value Ref Range   Troponin I (High Sensitivity) 4 <18 ng/L  Urinalysis, Routine w reflex microscopic -Urine, Clean Catch     Status: Abnormal   Collection Time: 12/03/22  1:31 PM  Result Value Ref Range   Color, Urine AMBER (A) YELLOW   APPearance HAZY (A) CLEAR   Specific Gravity, Urine 1.025 1.005 - 1.030   pH 5.0 5.0 - 8.0   Glucose, UA NEGATIVE NEGATIVE mg/dL   Hgb urine dipstick NEGATIVE NEGATIVE   Bilirubin Urine SMALL (A) NEGATIVE   Ketones, ur NEGATIVE NEGATIVE mg/dL   Protein, ur 30 (A) NEGATIVE mg/dL   Nitrite NEGATIVE NEGATIVE   Leukocytes,Ua NEGATIVE NEGATIVE   RBC / HPF 0-5 0 - 5 RBC/hpf   WBC, UA 6-10 0 - 5 WBC/hpf   Bacteria, UA FEW (A) NONE SEEN   Squamous Epithelial / HPF 0-5 0 - 5 /HPF   Mucus PRESENT   Troponin I (High Sensitivity)     Status: None   Collection Time: 12/03/22  3:40 PM  Result Value Ref Range   Troponin I (High Sensitivity) 3 <18 ng/L    Results for orders placed or performed during the hospital encounter of 12/03/22 (from the past 24 hour(s))  CBC with Differential     Status: Abnormal   Collection Time: 12/03/22  1:16 PM  Result Value Ref Range   WBC 6.0 4.0 - 10.5 K/uL   RBC 3.42 (L) 4.22 - 5.81 MIL/uL   Hemoglobin 12.3 (L) 13.0 - 17.0 g/dL   HCT 96.0 (L) 45.4 - 09.8 %   MCV 102.9 (H) 80.0 - 100.0 fL   MCH 36.0 (H) 26.0 - 34.0 pg   MCHC 34.9 30.0 - 36.0 g/dL   RDW 11.9 (H) 14.7 - 82.9 %   Platelets 113 (L) 150 - 400 K/uL   nRBC 0.0 0.0 - 0.2 %   Neutrophils Relative % 62 %   Neutro Abs 3.8 1.7 - 7.7 K/uL   Lymphocytes Relative 20 %   Lymphs Abs 1.2 0.7 - 4.0 K/uL   Monocytes Relative 16 %   Monocytes Absolute 1.0 0.1 - 1.0 K/uL   Eosinophils Relative 1 %   Eosinophils Absolute 0.1 0.0 - 0.5 K/uL   Basophils Relative 1 %   Basophils Absolute 0.1 0.0 -  0.1 K/uL   Immature Granulocytes 0 %   Abs Immature Granulocytes 0.02 0.00 - 0.07 K/uL  Brain natriuretic peptide     Status: None   Collection Time: 12/03/22  1:16 PM  Result Value Ref Range   B Natriuretic Peptide 62.0 0.0 - 100.0 pg/mL  Comprehensive metabolic panel     Status: Abnormal   Collection Time: 12/03/22  1:16 PM  Result Value Ref Range   Sodium 129 (L) 135 - 145 mmol/L   Potassium 3.4 (L) 3.5 - 5.1 mmol/L   Chloride 99 98 - 111 mmol/L   CO2 23 22 - 32 mmol/L   Glucose, Bld 87 70 - 99 mg/dL   BUN 9 8 - 23 mg/dL   Creatinine, Ser 5.62  0.61 - 1.24 mg/dL   Calcium 7.9 (L) 8.9 - 10.3 mg/dL   Total Protein 6.6 6.5 - 8.1 g/dL   Albumin 2.2 (L) 3.5 - 5.0 g/dL   AST 57 (H) 15 - 41 U/L   ALT 28 0 - 44 U/L   Alkaline Phosphatase 106 38 - 126 U/L   Total Bilirubin 6.3 (H) 0.3 - 1.2 mg/dL   GFR, Estimated >47 >42 mL/min   Anion gap 7 5 - 15  Ethanol     Status: None   Collection Time: 12/03/22  1:16 PM  Result Value Ref Range   Alcohol, Ethyl (B) <10 <10 mg/dL  Troponin I (High Sensitivity)     Status: None   Collection Time: 12/03/22  1:16 PM  Result Value Ref Range   Troponin I (High Sensitivity) 4 <18 ng/L  Urinalysis, Routine w reflex microscopic -Urine, Clean Catch     Status: Abnormal   Collection Time: 12/03/22  1:31 PM  Result Value Ref Range   Color, Urine AMBER (A) YELLOW   APPearance HAZY (A) CLEAR   Specific Gravity, Urine 1.025 1.005 - 1.030   pH 5.0 5.0 - 8.0   Glucose, UA NEGATIVE NEGATIVE mg/dL   Hgb urine dipstick NEGATIVE NEGATIVE   Bilirubin Urine SMALL (A) NEGATIVE   Ketones, ur NEGATIVE NEGATIVE mg/dL   Protein, ur 30 (A) NEGATIVE mg/dL   Nitrite NEGATIVE NEGATIVE   Leukocytes,Ua NEGATIVE NEGATIVE   RBC / HPF 0-5 0 - 5 RBC/hpf   WBC, UA 6-10 0 - 5 WBC/hpf   Bacteria, UA FEW (A) NONE SEEN   Squamous Epithelial / HPF 0-5 0 - 5 /HPF   Mucus PRESENT   Troponin I (High Sensitivity)     Status: None   Collection Time: 12/03/22  3:40 PM  Result  Value Ref Range   Troponin I (High Sensitivity) 3 <18 ng/L    EKG EKG Interpretation Date/Time:  Saturday December 03 2022 11:50:17 EDT Ventricular Rate:  69 PR Interval:  178 QRS Duration:  100 QT Interval:  436 QTC Calculation: 467 R Axis:   55  Text Interpretation: Normal sinus rhythm Normal ECG When compared with ECG of 12-Sep-2015 00:04, PREVIOUS ECG IS PRESENT Confirmed by Tanda Rockers ([id number]) on 12/03/2022 3:09:03 PM  Radiology CT ABDOMEN PELVIS W CONTRAST  Result Date: 12/03/2022 CLINICAL DATA:  Elevated bilirubin.  Abdominal distension. EXAM: CT ABDOMEN AND PELVIS WITH CONTRAST TECHNIQUE: Multidetector CT imaging of the abdomen and pelvis was performed using the standard protocol following bolus administration of intravenous contrast. RADIATION DOSE REDUCTION: This exam was performed according to the departmental dose-optimization program which includes automated exposure control, adjustment of the mA and/or kV according to patient size and/or use of iterative reconstruction technique. CONTRAST:  OMNIPAQUE IOHEXOL 300 MG/ML  SOLN COMPARISON:  Abdominal ultrasound 05/11/2021 FINDINGS: Lower chest: The heart is mildly enlarged. Trace left pleural effusion. No focal airspace disease. Hepatobiliary: Hepatic cirrhosis with nodular contours. Geographic areas of more decreased density suggest focal fatty deposition but are nonspecific. No obvious focal lesion. Punctate gallstones within partially distended gallbladder. No obvious gallbladder inflammation. No biliary dilatation. The portal vein is patent. Pancreas: No ductal dilatation or inflammation. Spleen: Mild splenomegaly, spleen measures 14.2 cm AP. Calcified granuloma but no focal lesion. Adrenals/Urinary Tract: No adrenal nodule. No hydronephrosis. No renal calculi. Subcentimeter low-density lesions in the lower right kidney are too small to characterize, favor cysts. No further follow-up imaging is recommended. Urinary bladder is  near completely decompressed.  Stomach/Bowel: Bowel assessment is limited in the presence of ascites as well as mild motion artifact. No bowel obstruction. No definite bowel inflammation. Small to moderate volume of stool in the colon. Left colonic diverticula without diverticulitis. The appendix is not confidently seen. Vascular/Lymphatic: Patent portal vein. Splenic vein is patent. Aortic and branch atherosclerosis, no aneurysm. Scattered porta hepatis nodes are typically reactive. No suspicious lymphadenopathy. Reproductive: Prostate is unremarkable. Other: Moderate to large volume abdominopelvic ascites. Ascitic fluid measures simple fluid density. There is generalized edema of the mesenteric and intra-abdominal fat. Strandy density in the omentum likely represents sequela of ascites. Mild skin thickening in the lower abdominal pannus with generalized subcutaneous edema. No free intra-abdominal air. Tiny fat containing umbilical hernia. Musculoskeletal: Diffuse thoracic spondylosis. There are no acute or suspicious osseous abnormalities. IMPRESSION: 1. Hepatic cirrhosis with moderate to large volume abdominopelvic ascites. Mild splenomegaly. 2. Cholelithiasis without CT findings of acute cholecystitis. 3. Colonic diverticulosis without diverticulitis. 4. Trace left pleural effusion. Aortic Atherosclerosis (ICD10-I70.0). Electronically Signed   By: Narda Rutherford M.D.   On: 12/03/2022 19:05   DG Chest 2 View  Result Date: 12/03/2022 CLINICAL DATA:  Shortness of breath. EXAM: CHEST - 2 VIEW COMPARISON:  03/09/2021. FINDINGS: Low lung volume. Bilateral lung fields are clear. Bilateral costophrenic angles are clear. Normal cardio-mediastinal silhouette. No acute osseous abnormalities. The soft tissues are within normal limits. IMPRESSION: No active cardiopulmonary disease. Electronically Signed   By: Jules Schick M.D.   On: 12/03/2022 12:59    Procedures Procedures    Medications Ordered in  ED Medications  albuterol (VENTOLIN HFA) 108 (90 Base) MCG/ACT inhaler 2 puff (has no administration in time range)  iohexol (OMNIPAQUE) 300 MG/ML solution 100 mL (100 mLs Intravenous Contrast Given 12/03/22 1657)    ED Course/ Medical Decision Making/ A&P Clinical Course as of 12/03/22 1933  Sat Dec 03, 2022  1317 Bladder scan showing >1000cc urine. Foley catheter ordered. [SU]  1625 Foley inserted with low volume return. Per RN who inserted the catheter, he immediately urinated around the catheter tubing on insertion as well as draining small amount of dark urine in the bag. Foley removed. Will trial for controlled urination.  [SU]    Clinical Course User Index [SU] Elpidio Anis, PA-C                                 Medical Decision Making Amount and/or Complexity of Data Reviewed Labs: ordered. Radiology: ordered.  Risk Prescription drug management.           Final Clinical Impression(s) / ED Diagnoses Final diagnoses:  Alcoholic cirrhosis of liver with ascites (HCC)    Rx / DC Orders ED Discharge Orders          Ordered    furosemide (LASIX) 20 MG tablet  Daily        12/03/22 1919              Elpidio Anis, PA-C 12/03/22 1933

## 2022-12-03 NOTE — ED Provider Notes (Cosign Needed Addendum)
[place] EMERGENCY DEPARTMENT AT [facility] Provider Note   CSN: [id number] Arrival date & time: 12/03/22  1122     History  Chief Complaint  Patient presents with   Shortness of Breath    Billy Chung is a 62 y.o. male.  Patient to ED at the insistence of his ex-wife for evaluation of progressively worsening bilateral LE swelling and abdominal swelling. Patient states he is a heavy drinker, up to a case of beer daily, but started having vomiting one week ago anytime he tried to drink. He has not had any alcohol for 7 days. He reports going through a period of having tremors but they have resolved. No seizure. No vomiting except when trying to drink alcohol. Per his former spouse, he is not eating and hasn't for several days. He reports he is drinking lots of water. No abdominal pain. No SOB or chest pain. He states he is having difficulty urinating describing having the need to urinate but only producing a few drops.   The history is provided by the patient and a friend. No language interpreter was used.  Shortness of Breath      Home Medications Prior to Admission medications   Medication Sig Start Date End Date Taking? Authorizing Provider  furosemide (LASIX) 20 MG tablet Take 1 tablet (20 mg total) by mouth daily. 12/03/22  Yes Elpidio Anis, PA-C  ALPRAZolam Prudy Feeler) 1 MG tablet Take 1 tablet by mouth 1 hour prior to procedure Patient not taking: Reported on 05/05/2021 02/24/21   Milderd Meager., MD  doxycycline (VIBRAMYCIN) 100 MG capsule Take 1 capsule (100 mg total) by mouth every 12 (twelve) hours. 05/11/21   Stoneking, Danford Bad., MD  HYDROcodone-acetaminophen (NORCO/VICODIN) 5-325 MG tablet Take 1 tablet by mouth every 6 (six) hours as needed for moderate pain. Patient not taking: Reported on 05/05/2021 04/02/21   Stoneking, Danford Bad., MD  meloxicam (MOBIC) 7.5 MG tablet Take 1 tablet (7.5 mg total) by mouth daily. 05/11/21   Stoneking, Danford Bad., MD       Allergies    No known allergies    Review of Systems   Review of Systems  Respiratory:  Positive for shortness of breath.     Physical Exam Updated Vital Signs BP 118/73 (BP Location: Right Arm)   Pulse 77   Temp 97.9 F (36.6 C) (Oral)   Resp 16   Ht 5\' 8"  (1.727 m)   Wt 121.1 kg   SpO2 100%   BMI 40.59 kg/m  Physical Exam Vitals reviewed.  Constitutional:      Appearance: He is well-developed. He is obese.  HENT:     Mouth/Throat:     Mouth: Mucous membranes are moist.  Cardiovascular:     Rate and Rhythm: Normal rate and regular rhythm.  Pulmonary:     Effort: Pulmonary effort is normal.     Breath sounds: Normal breath sounds. No wheezing.  Abdominal:     Comments: Protuberant abdomen with edema. No fluid wave. Nontender.   Musculoskeletal:     Cervical back: Normal range of motion.     Right lower leg: Edema present.     Left lower leg: Edema present.  Skin:    General: Skin is warm and dry.     Findings: No erythema.  Neurological:     Mental Status: He is alert and oriented to person, place, and time.     ED Results / Procedures / Treatments   Labs (all  labs ordered are listed, but only abnormal results are displayed) Labs Reviewed  CBC WITH DIFFERENTIAL/PLATELET - Abnormal; Notable for the following components:      Result Value   RBC 3.42 (*)    Hemoglobin 12.3 (*)    HCT 35.2 (*)    MCV 102.9 (*)    MCH 36.0 (*)    RDW 15.9 (*)    Platelets 113 (*)    All other components within normal limits  COMPREHENSIVE METABOLIC PANEL - Abnormal; Notable for the following components:   Sodium 129 (*)    Potassium 3.4 (*)    Calcium 7.9 (*)    Albumin 2.2 (*)    AST 57 (*)    Total Bilirubin 6.3 (*)    All other components within normal limits  URINALYSIS, ROUTINE W REFLEX MICROSCOPIC - Abnormal; Notable for the following components:   Color, Urine AMBER (*)    APPearance HAZY (*)    Bilirubin Urine SMALL (*)    Protein, ur 30 (*)     Bacteria, UA FEW (*)    All other components within normal limits  BRAIN NATRIURETIC PEPTIDE  ETHANOL  TROPONIN I (HIGH SENSITIVITY)  TROPONIN I (HIGH SENSITIVITY)   Results for orders placed or performed during the hospital encounter of 12/03/22 (from the past 24 hour(s))  CBC with Differential     Status: Abnormal   Collection Time: 12/03/22  1:16 PM  Result Value Ref Range   WBC 6.0 4.0 - 10.5 K/uL   RBC 3.42 (L) 4.22 - 5.81 MIL/uL   Hemoglobin 12.3 (L) 13.0 - 17.0 g/dL   HCT 13.2 (L) 44.0 - 10.2 %   MCV 102.9 (H) 80.0 - 100.0 fL   MCH 36.0 (H) 26.0 - 34.0 pg   MCHC 34.9 30.0 - 36.0 g/dL   RDW 72.5 (H) 36.6 - 44.0 %   Platelets 113 (L) 150 - 400 K/uL   nRBC 0.0 0.0 - 0.2 %   Neutrophils Relative % 62 %   Neutro Abs 3.8 1.7 - 7.7 K/uL   Lymphocytes Relative 20 %   Lymphs Abs 1.2 0.7 - 4.0 K/uL   Monocytes Relative 16 %   Monocytes Absolute 1.0 0.1 - 1.0 K/uL   Eosinophils Relative 1 %   Eosinophils Absolute 0.1 0.0 - 0.5 K/uL   Basophils Relative 1 %   Basophils Absolute 0.1 0.0 - 0.1 K/uL   Immature Granulocytes 0 %   Abs Immature Granulocytes 0.02 0.00 - 0.07 K/uL  Brain natriuretic peptide     Status: None   Collection Time: 12/03/22  1:16 PM  Result Value Ref Range   B Natriuretic Peptide 62.0 0.0 - 100.0 pg/mL  Comprehensive metabolic panel     Status: Abnormal   Collection Time: 12/03/22  1:16 PM  Result Value Ref Range   Sodium 129 (L) 135 - 145 mmol/L   Potassium 3.4 (L) 3.5 - 5.1 mmol/L   Chloride 99 98 - 111 mmol/L   CO2 23 22 - 32 mmol/L   Glucose, Bld 87 70 - 99 mg/dL   BUN 9 8 - 23 mg/dL   Creatinine, Ser 3.47 0.61 - 1.24 mg/dL   Calcium 7.9 (L) 8.9 - 10.3 mg/dL   Total Protein 6.6 6.5 - 8.1 g/dL   Albumin 2.2 (L) 3.5 - 5.0 g/dL   AST 57 (H) 15 - 41 U/L   ALT 28 0 - 44 U/L   Alkaline Phosphatase 106 38 - 126 U/L  Total Bilirubin 6.3 (H) 0.3 - 1.2 mg/dL   GFR, Estimated >24 >40 mL/min   Anion gap 7 5 - 15  Ethanol     Status: None   Collection  Time: 12/03/22  1:16 PM  Result Value Ref Range   Alcohol, Ethyl (B) <10 <10 mg/dL  Troponin I (High Sensitivity)     Status: None   Collection Time: 12/03/22  1:16 PM  Result Value Ref Range   Troponin I (High Sensitivity) 4 <18 ng/L  Urinalysis, Routine w reflex microscopic -Urine, Clean Catch     Status: Abnormal   Collection Time: 12/03/22  1:31 PM  Result Value Ref Range   Color, Urine AMBER (A) YELLOW   APPearance HAZY (A) CLEAR   Specific Gravity, Urine 1.025 1.005 - 1.030   pH 5.0 5.0 - 8.0   Glucose, UA NEGATIVE NEGATIVE mg/dL   Hgb urine dipstick NEGATIVE NEGATIVE   Bilirubin Urine SMALL (A) NEGATIVE   Ketones, ur NEGATIVE NEGATIVE mg/dL   Protein, ur 30 (A) NEGATIVE mg/dL   Nitrite NEGATIVE NEGATIVE   Leukocytes,Ua NEGATIVE NEGATIVE   RBC / HPF 0-5 0 - 5 RBC/hpf   WBC, UA 6-10 0 - 5 WBC/hpf   Bacteria, UA FEW (A) NONE SEEN   Squamous Epithelial / HPF 0-5 0 - 5 /HPF   Mucus PRESENT   Troponin I (High Sensitivity)     Status: None   Collection Time: 12/03/22  3:40 PM  Result Value Ref Range   Troponin I (High Sensitivity) 3 <18 ng/L    Results for orders placed or performed during the hospital encounter of 12/03/22 (from the past 24 hour(s))  CBC with Differential     Status: Abnormal   Collection Time: 12/03/22  1:16 PM  Result Value Ref Range   WBC 6.0 4.0 - 10.5 K/uL   RBC 3.42 (L) 4.22 - 5.81 MIL/uL   Hemoglobin 12.3 (L) 13.0 - 17.0 g/dL   HCT 10.2 (L) 72.5 - 36.6 %   MCV 102.9 (H) 80.0 - 100.0 fL   MCH 36.0 (H) 26.0 - 34.0 pg   MCHC 34.9 30.0 - 36.0 g/dL   RDW 44.0 (H) 34.7 - 42.5 %   Platelets 113 (L) 150 - 400 K/uL   nRBC 0.0 0.0 - 0.2 %   Neutrophils Relative % 62 %   Neutro Abs 3.8 1.7 - 7.7 K/uL   Lymphocytes Relative 20 %   Lymphs Abs 1.2 0.7 - 4.0 K/uL   Monocytes Relative 16 %   Monocytes Absolute 1.0 0.1 - 1.0 K/uL   Eosinophils Relative 1 %   Eosinophils Absolute 0.1 0.0 - 0.5 K/uL   Basophils Relative 1 %   Basophils Absolute 0.1 0.0 -  0.1 K/uL   Immature Granulocytes 0 %   Abs Immature Granulocytes 0.02 0.00 - 0.07 K/uL  Brain natriuretic peptide     Status: None   Collection Time: 12/03/22  1:16 PM  Result Value Ref Range   B Natriuretic Peptide 62.0 0.0 - 100.0 pg/mL  Comprehensive metabolic panel     Status: Abnormal   Collection Time: 12/03/22  1:16 PM  Result Value Ref Range   Sodium 129 (L) 135 - 145 mmol/L   Potassium 3.4 (L) 3.5 - 5.1 mmol/L   Chloride 99 98 - 111 mmol/L   CO2 23 22 - 32 mmol/L   Glucose, Bld 87 70 - 99 mg/dL   BUN 9 8 - 23 mg/dL   Creatinine, Ser 9.56  0.61 - 1.24 mg/dL   Calcium 7.9 (L) 8.9 - 10.3 mg/dL   Total Protein 6.6 6.5 - 8.1 g/dL   Albumin 2.2 (L) 3.5 - 5.0 g/dL   AST 57 (H) 15 - 41 U/L   ALT 28 0 - 44 U/L   Alkaline Phosphatase 106 38 - 126 U/L   Total Bilirubin 6.3 (H) 0.3 - 1.2 mg/dL   GFR, Estimated >16 >10 mL/min   Anion gap 7 5 - 15  Ethanol     Status: None   Collection Time: 12/03/22  1:16 PM  Result Value Ref Range   Alcohol, Ethyl (B) <10 <10 mg/dL  Troponin I (High Sensitivity)     Status: None   Collection Time: 12/03/22  1:16 PM  Result Value Ref Range   Troponin I (High Sensitivity) 4 <18 ng/L  Urinalysis, Routine w reflex microscopic -Urine, Clean Catch     Status: Abnormal   Collection Time: 12/03/22  1:31 PM  Result Value Ref Range   Color, Urine AMBER (A) YELLOW   APPearance HAZY (A) CLEAR   Specific Gravity, Urine 1.025 1.005 - 1.030   pH 5.0 5.0 - 8.0   Glucose, UA NEGATIVE NEGATIVE mg/dL   Hgb urine dipstick NEGATIVE NEGATIVE   Bilirubin Urine SMALL (A) NEGATIVE   Ketones, ur NEGATIVE NEGATIVE mg/dL   Protein, ur 30 (A) NEGATIVE mg/dL   Nitrite NEGATIVE NEGATIVE   Leukocytes,Ua NEGATIVE NEGATIVE   RBC / HPF 0-5 0 - 5 RBC/hpf   WBC, UA 6-10 0 - 5 WBC/hpf   Bacteria, UA FEW (A) NONE SEEN   Squamous Epithelial / HPF 0-5 0 - 5 /HPF   Mucus PRESENT   Troponin I (High Sensitivity)     Status: None   Collection Time: 12/03/22  3:40 PM  Result  Value Ref Range   Troponin I (High Sensitivity) 3 <18 ng/L    EKG EKG Interpretation Date/Time:  Saturday December 03 2022 11:50:17 EDT Ventricular Rate:  69 PR Interval:  178 QRS Duration:  100 QT Interval:  436 QTC Calculation: 467 R Axis:   55  Text Interpretation: Normal sinus rhythm Normal ECG When compared with ECG of 12-Sep-2015 00:04, PREVIOUS ECG IS PRESENT Confirmed by Tanda Rockers ([id number]) on 12/03/2022 3:09:03 PM  Radiology CT ABDOMEN PELVIS W CONTRAST  Result Date: 12/03/2022 CLINICAL DATA:  Elevated bilirubin.  Abdominal distension. EXAM: CT ABDOMEN AND PELVIS WITH CONTRAST TECHNIQUE: Multidetector CT imaging of the abdomen and pelvis was performed using the standard protocol following bolus administration of intravenous contrast. RADIATION DOSE REDUCTION: This exam was performed according to the departmental dose-optimization program which includes automated exposure control, adjustment of the mA and/or kV according to patient size and/or use of iterative reconstruction technique. CONTRAST:  OMNIPAQUE IOHEXOL 300 MG/ML  SOLN COMPARISON:  Abdominal ultrasound 05/11/2021 FINDINGS: Lower chest: The heart is mildly enlarged. Trace left pleural effusion. No focal airspace disease. Hepatobiliary: Hepatic cirrhosis with nodular contours. Geographic areas of more decreased density suggest focal fatty deposition but are nonspecific. No obvious focal lesion. Punctate gallstones within partially distended gallbladder. No obvious gallbladder inflammation. No biliary dilatation. The portal vein is patent. Pancreas: No ductal dilatation or inflammation. Spleen: Mild splenomegaly, spleen measures 14.2 cm AP. Calcified granuloma but no focal lesion. Adrenals/Urinary Tract: No adrenal nodule. No hydronephrosis. No renal calculi. Subcentimeter low-density lesions in the lower right kidney are too small to characterize, favor cysts. No further follow-up imaging is recommended. Urinary bladder is  near completely decompressed.  Stomach/Bowel: Bowel assessment is limited in the presence of ascites as well as mild motion artifact. No bowel obstruction. No definite bowel inflammation. Small to moderate volume of stool in the colon. Left colonic diverticula without diverticulitis. The appendix is not confidently seen. Vascular/Lymphatic: Patent portal vein. Splenic vein is patent. Aortic and branch atherosclerosis, no aneurysm. Scattered porta hepatis nodes are typically reactive. No suspicious lymphadenopathy. Reproductive: Prostate is unremarkable. Other: Moderate to large volume abdominopelvic ascites. Ascitic fluid measures simple fluid density. There is generalized edema of the mesenteric and intra-abdominal fat. Strandy density in the omentum likely represents sequela of ascites. Mild skin thickening in the lower abdominal pannus with generalized subcutaneous edema. No free intra-abdominal air. Tiny fat containing umbilical hernia. Musculoskeletal: Diffuse thoracic spondylosis. There are no acute or suspicious osseous abnormalities. IMPRESSION: 1. Hepatic cirrhosis with moderate to large volume abdominopelvic ascites. Mild splenomegaly. 2. Cholelithiasis without CT findings of acute cholecystitis. 3. Colonic diverticulosis without diverticulitis. 4. Trace left pleural effusion. Aortic Atherosclerosis (ICD10-I70.0). Electronically Signed   By: Narda Rutherford M.D.   On: 12/03/2022 19:05   DG Chest 2 View  Result Date: 12/03/2022 CLINICAL DATA:  Shortness of breath. EXAM: CHEST - 2 VIEW COMPARISON:  03/09/2021. FINDINGS: Low lung volume. Bilateral lung fields are clear. Bilateral costophrenic angles are clear. Normal cardio-mediastinal silhouette. No acute osseous abnormalities. The soft tissues are within normal limits. IMPRESSION: No active cardiopulmonary disease. Electronically Signed   By: Jules Schick M.D.   On: 12/03/2022 12:59    Procedures Procedures    Medications Ordered in  ED Medications  albuterol (VENTOLIN HFA) 108 (90 Base) MCG/ACT inhaler 2 puff (has no administration in time range)  iohexol (OMNIPAQUE) 300 MG/ML solution 100 mL (100 mLs Intravenous Contrast Given 12/03/22 1657)    ED Course/ Medical Decision Making/ A&P Clinical Course as of 12/03/22 1933  Sat Dec 03, 2022  1317 Bladder scan showing >1000cc urine. Foley catheter ordered. [SU]  1625 Foley inserted with low volume return. Per RN who inserted the catheter, he immediately urinated around the catheter tubing on insertion as well as draining small amount of dark urine in the bag. Foley removed. Will trial for controlled urination.  [SU]    Clinical Course User Index [SU] Elpidio Anis, PA-C                                 Medical Decision Making Amount and/or Complexity of Data Reviewed Labs: ordered. Radiology: ordered.  Risk Prescription drug management.           Final Clinical Impression(s) / ED Diagnoses Final diagnoses:  Alcoholic cirrhosis of liver with ascites (HCC)    Rx / DC Orders ED Discharge Orders          Ordered    furosemide (LASIX) 20 MG tablet  Daily        12/03/22 1919              Elpidio Anis, PA-C 12/03/22 1933

## 2022-12-03 NOTE — Discharge Instructions (Signed)
Continue to abstain from alcohol.  Take Lasix daily to help with fluid retention.  Follow up with gastroenterology for further management of liver disease.

## 2022-12-07 ENCOUNTER — Ambulatory Visit (HOSPITAL_COMMUNITY)
Admission: RE | Admit: 2022-12-07 | Discharge: 2022-12-07 | Disposition: A | Discharge status: Home / Self Care | Payer: No Typology Code available for payment source | Source: Ambulatory Visit | Attending: Gastroenterology | Admitting: Gastroenterology

## 2022-12-07 ENCOUNTER — Other Ambulatory Visit: Payer: Self-pay | Admitting: *Deleted

## 2022-12-07 ENCOUNTER — Other Ambulatory Visit: Payer: Self-pay | Admitting: Gastroenterology

## 2022-12-07 ENCOUNTER — Encounter (HOSPITAL_COMMUNITY): Payer: Self-pay

## 2022-12-07 ENCOUNTER — Encounter: Payer: Self-pay | Admitting: Gastroenterology

## 2022-12-07 ENCOUNTER — Ambulatory Visit (INDEPENDENT_AMBULATORY_CARE_PROVIDER_SITE_OTHER): Payer: No Typology Code available for payment source | Admitting: Gastroenterology

## 2022-12-07 VITALS — BP 129/63 | HR 69 | Temp 98.0°F | Ht 68.0 in | Wt 256.4 lb

## 2022-12-07 DIAGNOSIS — F1010 Alcohol abuse, uncomplicated: Secondary | ICD-10-CM | POA: Insufficient documentation

## 2022-12-07 DIAGNOSIS — R188 Other ascites: Secondary | ICD-10-CM | POA: Insufficient documentation

## 2022-12-07 DIAGNOSIS — R0602 Shortness of breath: Secondary | ICD-10-CM | POA: Insufficient documentation

## 2022-12-07 DIAGNOSIS — K7010 Alcoholic hepatitis without ascites: Secondary | ICD-10-CM

## 2022-12-07 DIAGNOSIS — K5900 Constipation, unspecified: Secondary | ICD-10-CM | POA: Insufficient documentation

## 2022-12-07 DIAGNOSIS — Z8379 Family history of other diseases of the digestive system: Secondary | ICD-10-CM

## 2022-12-07 DIAGNOSIS — R600 Localized edema: Secondary | ICD-10-CM

## 2022-12-07 DIAGNOSIS — F1991 Other psychoactive substance use, unspecified, in remission: Secondary | ICD-10-CM

## 2022-12-07 DIAGNOSIS — K7460 Unspecified cirrhosis of liver: Secondary | ICD-10-CM | POA: Diagnosis not present

## 2022-12-07 DIAGNOSIS — E871 Hypo-osmolality and hyponatremia: Secondary | ICD-10-CM | POA: Insufficient documentation

## 2022-12-07 DIAGNOSIS — R601 Generalized edema: Secondary | ICD-10-CM | POA: Insufficient documentation

## 2022-12-07 LAB — BODY FLUID CELL COUNT WITH DIFFERENTIAL
Eos, Fluid: 0 %
Lymphs, Fluid: 37 %
Monocyte-Macrophage-Serous Fluid: 59 % (ref 50–90)
Neutrophil Count, Fluid: 4 % (ref 0–25)
Total Nucleated Cell Count, Fluid: 251 uL (ref 0–1000)

## 2022-12-07 LAB — GRAM STAIN

## 2022-12-07 MED ORDER — ALBUMIN HUMAN 25 % IV SOLN
0.0000 g | Freq: Once | INTRAVENOUS | Status: AC
  Administered 2022-12-07: 25 g via INTRAVENOUS
  Filled 2022-12-07: qty 400

## 2022-12-07 MED ORDER — ALBUMIN HUMAN 25 % IV SOLN
INTRAVENOUS | Status: AC
  Filled 2022-12-07: qty 100

## 2022-12-07 NOTE — Progress Notes (Signed)
Patient tolerated left sided paracentesis and 25G of IV albumin well today and 4 Liters of yellow ascites removed with labs collected and sent for processing. Patient verbalized understanding of discharge instructions and ambulatory at departure with no acute distress noted.

## 2022-12-07 NOTE — Procedures (Signed)
PROCEDURE SUMMARY:  Successful ultrasound guided paracentesis from the left lower quadrant.  Yielded 4.0 L of clear yellow fluid.  No immediate complications.  The patient tolerated the procedure well.   Specimen was sent for labs.  EBL < 5mL  If the patient eventually requires >/=2 paracenteses in a 30 day period, screening evaluation by the [facility] will be assessed.  Lynnette Caffey, PA-C

## 2022-12-07 NOTE — Progress Notes (Addendum)
GI Office Note    Referring Provider: Gareth Morgan, MD Primary Care Physician:  Gareth Morgan, MD  Primary Gastroenterologist: Roetta Sessions, MD   Chief Complaint   Chief Complaint  Patient presents with   Cirrhosis    Swollen, requesting fluid to be drawn off.     History of Present Illness   Billy Chung is a 62 y.o. male presenting today for further evaluation of cirrhosis.  Recent ED evaluation 12/03/22 at the insistence of ex-wife for evaluation of progressively worsening bilateral lower extremity edema and abdominal swelling.  Reported drinking up to a case of beer daily.  Over the past week prior to presentation however every time he tried to drink alcohol he had vomiting.  Was having a period of tremors but this resolved.  No significant oral intake except water.  Decreased urinary output. Bladder scan showed greater than 1 L of urine present.  Foley catheter placed with very little return but patient urinated around the catheter tubing.  Foley removed.  CT no evidence of urinary bladder distention.  CT showed hepatic cirrhosis with moderate to large volume ascites, mild splenomegaly, cholelithiasis but no acute cholecystitis, diverticulosis, trace left pleural effusion.  Heart mildly enlarged.  Portal vein patent.   Labs with mild macrocytic anemia, thrombocytopenia, hyponatremia with sodium of 129.  Creatinine normal at 0.69.  AST mildly elevated at 57, ALT normal, total bilirubin 6.3.  Albumin 2.2.  Hemolyzed for additional labs.  Patient states his swelling has been present for some time but he thought it was due to obesity. However over the past several months he noted increasing abdominal girth, lower extremity edema. He had complex right lower leg/ankle fracture in 2017 and has had right lower extremity edema > right since that time but swelling worse now in both legs. Finds it difficult to breathe or work due to swollen abdomen. Feels very uncomfortable.  Barely eating due to feeling too full. Last ate 24 hours ago. Has been going days without eating.  No further vomiting. No hematemesis. Last BM 8 days ago. No melena, brbpr. Typically has BM 2-3 times per day. Complains of frequent coughing worse the past couple of weeks. Coughs up little phlegm. No heartburn. No fever. No abdominal pain.   States he has been told for years that he has some liver damage from etoh. He notes his maternal grandmother and maternal aunt had cirrhosis and did not drink.   Last etoh use 16 days ago.    Recently start lasix after d/c from the ED. No significant increase in urinary output.   Wt Readings from Last 3 Encounters:  12/07/22 256 lb 6.4 oz (116.3 kg)  12/03/22 266 lb 15.6 oz (121.1 kg)  05/05/21 267 lb (121.1 kg)    Medications   Current Outpatient Medications  Medication Sig Dispense Refill   furosemide (LASIX) 20 MG tablet Take 1 tablet (20 mg total) by mouth daily. 30 tablet 0   No current facility-administered medications for this visit.    Allergies   Allergies as of 12/07/2022 - Review Complete 12/07/2022  Allergen Reaction Noted   No known allergies  11/04/2015    Past Medical History   Past Medical History:  Diagnosis Date   Alcohol abuse    Pneumonia     Past Surgical History   Past Surgical History:  Procedure Laterality Date   APPENDECTOMY     ORIF ANKLE FRACTURE Right 11/04/2015   Procedure: OPEN REDUCTION INTERNAL FIXATION (ORIF)  ANKLE FRACTURE;  Surgeon: Nadara Mustard, MD;  Location: [facility] OR;  Service: Orthopedics;  Laterality: Right;    Past Family History   Family History  Problem Relation Age of Onset   Cirrhosis Maternal Grandmother        no etoh   Cirrhosis Maternal Aunt        no etoh   Colon cancer Neg Hx     Past Social History   Social History   Socioeconomic History   Marital status: Single    Spouse name: Not on file   Number of children: Not on file   Years of education: Not on file    Highest education level: Not on file  Occupational History   Not on file  Tobacco Use   Smoking status: Former   Smokeless tobacco: Former    Types: Engineer, drilling   Vaping status: Never Used  Substance and Sexual Activity   Alcohol use: Not Currently    Alcohol/week: 168.0 standard drinks of alcohol    Types: 168 Cans of beer per week    Comment: daily (stopped 11/24/22)   Drug use: Not Currently    Types: Cocaine    Comment: former drug use (2 years ago)   Sexual activity: Yes  Other Topics Concern   Not on file  Social History Narrative   Not on file   Social Determinants of Health   Financial Resource Strain: Not on file  Food Insecurity: Not on file  Transportation Needs: Not on file  Physical Activity: Not on file  Stress: Not on file  Social Connections: Not on file  Intimate Partner Violence: Not on file    Review of Systems   General: Negative for anorexia, weight loss, fever, chills, +fatigue, +weakness. Eyes: Negative for vision changes.  ENT: Negative for hoarseness, difficulty swallowing , nasal congestion. CV: Negative for chest pain, angina, palpitations, +dyspnea on exertion, +peripheral edema.  Respiratory: Negative for dyspnea at rest,+ dyspnea on exertion,+ cough.  GI: See history of present illness. GU:  Negative for dysuria, hematuria, urinary incontinence, urinary frequency, nocturnal urination.  MS: Negative for joint pain, low back pain.  Derm: Negative for rash or itching.  Neuro: Negative for weakness, abnormal sensation, seizure, frequent headaches, memory loss,  confusion.  Psych: Negative for anxiety, depression, suicidal ideation, hallucinations.  Endo: Negative for unusual weight change.  Heme: Negative for bruising or bleeding. Allergy: Negative for rash or hives.  Physical Exam   BP 129/63 (BP Location: Right Arm, Patient Position: Sitting, Cuff Size: Large)   Pulse 69   Temp 98 F (36.7 C) (Oral)   Ht 5\' 8"  (1.727 m)   Wt  256 lb 6.4 oz (116.3 kg)   SpO2 97%   BMI 38.99 kg/m    General:chronically ill appearing male in no acute distress. Easily winded with ambulation in room. Quickly recovers. Frequent cough. Head: Normocephalic, atraumatic.   Eyes: Conjunctiva pink, + icterus. Mouth: Oropharyngeal mucosa moist and pink   Neck: Supple without thyromegaly, masses, or lymphadenopathy.  Lungs: Clear to auscultation bilaterally.  Heart: Regular rate and rhythm, no murmurs rubs or gallops.  Abdomen: Bowel sounds are normal, nontender, distended moderately tense, no hepatosplenomegaly or masses,  no abdominal bruits or hernia, no rebound or guarding.   Rectal: not performed Extremities: 2+pitting edema LLE, 3+ RLE. Reports chronic RLE due to complex leg fracture in past.    Neuro: Alert and oriented x 4 , grossly normal neurologically.  Skin: Warm  and dry, no rash. + jaundice.   Psych: Alert and cooperative, normal mood and affect.  Labs   December 03 2022: Ethanol less than 10, bnp 62  Lab Results  Component Value Date   NA 129 (L) 12/03/2022   CL 99 12/03/2022   K 3.4 (L) 12/03/2022   CO2 23 12/03/2022   BUN 9 12/03/2022   CREATININE 0.69 12/03/2022   GFRNONAA >60 12/03/2022   CALCIUM 7.9 (L) 12/03/2022   ALBUMIN 2.2 (L) 12/03/2022   GLUCOSE 87 12/03/2022   Lab Results  Component Value Date   ALT 28 12/03/2022   AST 57 (H) 12/03/2022   ALKPHOS 106 12/03/2022   BILITOT 6.3 (H) 12/03/2022   Lab Results  Component Value Date   WBC 6.0 12/03/2022   HGB 12.3 (L) 12/03/2022   HCT 35.2 (L) 12/03/2022   MCV 102.9 (H) 12/03/2022   PLT 113 (L) 12/03/2022    Imaging Studies   CT ABDOMEN PELVIS W CONTRAST  Result Date: 12/03/2022 CLINICAL DATA:  Elevated bilirubin.  Abdominal distension. EXAM: CT ABDOMEN AND PELVIS WITH CONTRAST TECHNIQUE: Multidetector CT imaging of the abdomen and pelvis was performed using the standard protocol following bolus administration of intravenous contrast.  RADIATION DOSE REDUCTION: This exam was performed according to the departmental dose-optimization program which includes automated exposure control, adjustment of the mA and/or kV according to patient size and/or use of iterative reconstruction technique. CONTRAST:  OMNIPAQUE IOHEXOL 300 MG/ML  SOLN COMPARISON:  Abdominal ultrasound 05/11/2021 FINDINGS: Lower chest: The heart is mildly enlarged. Trace left pleural effusion. No focal airspace disease. Hepatobiliary: Hepatic cirrhosis with nodular contours. Geographic areas of more decreased density suggest focal fatty deposition but are nonspecific. No obvious focal lesion. Punctate gallstones within partially distended gallbladder. No obvious gallbladder inflammation. No biliary dilatation. The portal vein is patent. Pancreas: No ductal dilatation or inflammation. Spleen: Mild splenomegaly, spleen measures 14.2 cm AP. Calcified granuloma but no focal lesion. Adrenals/Urinary Tract: No adrenal nodule. No hydronephrosis. No renal calculi. Subcentimeter low-density lesions in the lower right kidney are too small to characterize, favor cysts. No further follow-up imaging is recommended. Urinary bladder is near completely decompressed. Stomach/Bowel: Bowel assessment is limited in the presence of ascites as well as mild motion artifact. No bowel obstruction. No definite bowel inflammation. Small to moderate volume of stool in the colon. Left colonic diverticula without diverticulitis. The appendix is not confidently seen. Vascular/Lymphatic: Patent portal vein. Splenic vein is patent. Aortic and branch atherosclerosis, no aneurysm. Scattered porta hepatis nodes are typically reactive. No suspicious lymphadenopathy. Reproductive: Prostate is unremarkable. Other: Moderate to large volume abdominopelvic ascites. Ascitic fluid measures simple fluid density. There is generalized edema of the mesenteric and intra-abdominal fat. Strandy density in the omentum likely  represents sequela of ascites. Mild skin thickening in the lower abdominal pannus with generalized subcutaneous edema. No free intra-abdominal air. Tiny fat containing umbilical hernia. Musculoskeletal: Diffuse thoracic spondylosis. There are no acute or suspicious osseous abnormalities. IMPRESSION: 1. Hepatic cirrhosis with moderate to large volume abdominopelvic ascites. Mild splenomegaly. 2. Cholelithiasis without CT findings of acute cholecystitis. 3. Colonic diverticulosis without diverticulitis. 4. Trace left pleural effusion. Aortic Atherosclerosis (ICD10-I70.0). Electronically Signed   By: Narda Rutherford M.D.   On: 12/03/2022 19:05   DG Chest 2 View  Result Date: 12/03/2022 CLINICAL DATA:  Shortness of breath. EXAM: CHEST - 2 VIEW COMPARISON:  03/09/2021. FINDINGS: Low lung volume. Bilateral lung fields are clear. Bilateral costophrenic angles are clear. Normal cardio-mediastinal silhouette. No  acute osseous abnormalities. The soft tissues are within normal limits. IMPRESSION: No active cardiopulmonary disease. Electronically Signed   By: Jules Schick M.D.   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 Consider additional etiologies -update labs today, calculate DF and MELD 3.0. check Hep A/B immune status -2 gram sodium diet -first paracentesis today to be schedule with fluid analysis, limit to 4 liters removed, IV albumin 25g to be given -continue furosemide for now, will make diuretic adjustments once labs return -no raw seafood -return ov in 3 weeks -will need EGD for esophageal variceal screening, to discuss at next ov.  Right greater than left lower extremity edem -patient reports right leg chronically swollen since complex fracture -will check Ddimer, if positive, consider dopplers  ETOH abuse -quit 11/21/22  H/O previous illicit drug use  -none in over two  years  Constipation -in setting of poor oral intake -add miralax, two capfuls daily until soft stools, then one capful daily     Leanna Battles. Melvyn Neth, MHS, PA-C [facility]

## 2022-12-07 NOTE — Patient Instructions (Addendum)
Complete labs today at [facility]. Start miralax two capfuls in 16 ounces of water daily until you pass soft stool, then continue one capful daily. We are scheduling you for paracentesis (to have fluid drawn off your abdomen).  Continue furosemide for now. We will make adjustments on your fluid pills after lab results return. No alcohol from here on out. Two gram sodium/salt diet. No more as too much salt will cause further fluid retention. No raw seafood. Return office visit in 3 weeks.

## 2022-12-08 LAB — B12 AND FOLATE PANEL
Folate: 4.5 ng/mL (ref >3.0)
Vitamin B-12: >2000 pg/mL — ABNORMAL HIGH (ref 232–1245)

## 2022-12-09 ENCOUNTER — Ambulatory Visit (HOSPITAL_COMMUNITY)
Admission: RE | Admit: 2022-12-09 | Discharge: 2022-12-09 | Disposition: A | Discharge status: Home / Self Care | Payer: No Typology Code available for payment source | Source: Ambulatory Visit | Attending: Gastroenterology | Admitting: Gastroenterology

## 2022-12-09 DIAGNOSIS — R188 Other ascites: Secondary | ICD-10-CM | POA: Diagnosis present

## 2022-12-09 LAB — IRON,TIBC AND FERRITIN PANEL
Ferritin: 625 ng/mL — ABNORMAL HIGH (ref 30–400)
Iron Saturation: 41 % (ref 15–55)
Iron: 70 ug/dL (ref 38–169)
Total Iron Binding Capacity: 170 ug/dL — ABNORMAL LOW (ref 250–450)
UIBC: 100 ug/dL — ABNORMAL LOW (ref 111–343)

## 2022-12-09 LAB — IGG, IGA, IGM
IgA/Immunoglobulin A, Serum: 1105 mg/dL — ABNORMAL HIGH (ref 61–437)
IgG (Immunoglobin G), Serum: 2631 mg/dL — ABNORMAL HIGH (ref 603–1613)
IgM (Immunoglobulin M), Srm: 256 mg/dL — ABNORMAL HIGH (ref 20–172)

## 2022-12-09 LAB — BASIC METABOLIC PANEL
BUN/Creatinine Ratio: 12 (ref 10–24)
BUN: 9 mg/dL (ref 8–27)
CO2: 20 mmol/L (ref 20–29)
Calcium: 8.1 mg/dL — ABNORMAL LOW (ref 8.6–10.2)
Chloride: 99 mmol/L (ref 96–106)
Creatinine, Ser: 0.78 mg/dL (ref 0.76–1.27)
Glucose: 81 mg/dL (ref 70–99)
Potassium: 3.8 mmol/L (ref 3.5–5.2)
Sodium: 133 mmol/L — ABNORMAL LOW (ref 134–144)
eGFR: 100 mL/min/{1.73_m2} (ref >59)

## 2022-12-09 LAB — TISSUE TRANSGLUTAMINASE, IGA: Transglutaminase IgA: 2 U/mL (ref 0–3)

## 2022-12-09 LAB — HEPATIC FUNCTION PANEL
ALT: 25 [IU]/L (ref 0–44)
AST: 54 [IU]/L — ABNORMAL HIGH (ref 0–40)
Albumin: 2.7 g/dL — ABNORMAL LOW (ref 3.9–4.9)
Alkaline Phosphatase: 134 [IU]/L — ABNORMAL HIGH (ref 44–121)
Bilirubin Total: 5.5 mg/dL — ABNORMAL HIGH (ref 0.0–1.2)
Bilirubin, Direct: 3.28 mg/dL — ABNORMAL HIGH (ref 0.00–0.40)
Total Protein: 7 g/dL (ref 6.0–8.5)

## 2022-12-09 LAB — GRAM STAIN

## 2022-12-09 LAB — CBC WITH DIFFERENTIAL/PLATELET
Basophils Absolute: 0 10*3/uL (ref 0.0–0.2)
Basos: 1 %
EOS (ABSOLUTE): 0.1 10*3/uL (ref 0.0–0.4)
Eos: 1 %
Hematocrit: 35.9 % — ABNORMAL LOW (ref 37.5–51.0)
Hemoglobin: 12.8 g/dL — ABNORMAL LOW (ref 13.0–17.7)
Immature Grans (Abs): 0 10*3/uL (ref 0.0–0.1)
Immature Granulocytes: 1 %
Lymphocytes Absolute: 1.5 10*3/uL (ref 0.7–3.1)
Lymphs: 25 %
MCH: 34.9 pg — ABNORMAL HIGH (ref 26.6–33.0)
MCHC: 35.7 g/dL (ref 31.5–35.7)
MCV: 98 fL — ABNORMAL HIGH (ref 79–97)
Monocytes Absolute: 0.9 10*3/uL (ref 0.1–0.9)
Monocytes: 15 %
Neutrophils Absolute: 3.4 10*3/uL (ref 1.4–7.0)
Neutrophils: 57 %
Platelets: 196 10*3/uL (ref 150–450)
RBC: 3.67 x10E6/uL — ABNORMAL LOW (ref 4.14–5.80)
RDW: 12.4 % (ref 11.6–15.4)
WBC: 5.9 10*3/uL (ref 3.4–10.8)

## 2022-12-09 LAB — HEPATITIS B SURFACE ANTIBODY,QUALITATIVE: Hep B Surface Ab, Qual: NONREACTIVE

## 2022-12-09 LAB — BODY FLUID CELL COUNT WITH DIFFERENTIAL
Eos, Fluid: 0 %
Lymphs, Fluid: 29 %
Monocyte-Macrophage-Serous Fluid: 64 % (ref 50–90)
Neutrophil Count, Fluid: 7 % (ref 0–25)
Total Nucleated Cell Count, Fluid: 316 uL (ref 0–1000)

## 2022-12-09 LAB — MITOCHONDRIAL/SMOOTH MUSCLE AB PNL
Mitochondrial Ab: <20 U (ref 0.0–20.0)
Smooth Muscle Ab: 91 U — ABNORMAL HIGH (ref 0–19)

## 2022-12-09 LAB — PROTIME-INR
INR: 1.5 — ABNORMAL HIGH (ref 0.9–1.2)
Prothrombin Time: 16.5 s — ABNORMAL HIGH (ref 9.1–12.0)

## 2022-12-09 LAB — ACID FAST SMEAR (AFB, MYCOBACTERIA): Acid Fast Smear: NEGATIVE

## 2022-12-09 LAB — HEPATITIS A ANTIBODY, TOTAL: hep A Total Ab: POSITIVE — AB

## 2022-12-09 LAB — HEPATITIS C ANTIBODY: Hep C Virus Ab: NONREACTIVE

## 2022-12-09 LAB — AFP TUMOR MARKER: AFP, Serum, Tumor Marker: 6.3 ng/mL (ref 0.0–8.4)

## 2022-12-09 LAB — HEPATITIS B CORE ANTIBODY, TOTAL: Hep B Core Total Ab: NEGATIVE

## 2022-12-09 LAB — HEPATITIS B SURFACE ANTIGEN: Hepatitis B Surface Ag: NEGATIVE

## 2022-12-09 LAB — ANA: Anti Nuclear Antibody (ANA): NEGATIVE

## 2022-12-09 LAB — D-DIMER, QUANTITATIVE: D-DIMER: 14.27 mg{FEU}/L — ABNORMAL HIGH (ref 0.00–0.49)

## 2022-12-09 MED ORDER — ALBUMIN HUMAN 25 % IV SOLN
INTRAVENOUS | Status: AC
  Filled 2022-12-09: qty 100

## 2022-12-09 NOTE — Progress Notes (Signed)
Pt brought to US1 ambulatory. Procedure explained, consent obtained. Prepped and draped in sterile manner. Local anesthetic admin with no difficulty. Access obtained atRLQ. Clear serous fluid retrieved. Labs collected, sent to laboratory. Pt refusing IV access at this time. 5.5 L removed total. Access removed with no difficulty. Pressure held. Dermabond placed, bandaged. No bleeding or hematoma present. Transported to waiting area.

## 2022-12-12 ENCOUNTER — Other Ambulatory Visit: Payer: Self-pay | Admitting: *Deleted

## 2022-12-12 ENCOUNTER — Telehealth: Payer: Self-pay

## 2022-12-12 ENCOUNTER — Other Ambulatory Visit (HOSPITAL_COMMUNITY): Payer: No Typology Code available for payment source

## 2022-12-12 DIAGNOSIS — R600 Localized edema: Secondary | ICD-10-CM

## 2022-12-12 DIAGNOSIS — R7989 Other specified abnormal findings of blood chemistry: Secondary | ICD-10-CM

## 2022-12-12 DIAGNOSIS — R188 Other ascites: Secondary | ICD-10-CM

## 2022-12-12 LAB — ACID FAST SMEAR (AFB, MYCOBACTERIA): Acid Fast Smear: NEGATIVE

## 2022-12-12 LAB — CULTURE, BODY FLUID W GRAM STAIN -BOTTLE: Culture: NO GROWTH

## 2022-12-12 LAB — CYTOLOGY - NON PAP

## 2022-12-12 LAB — PATHOLOGIST SMEAR REVIEW

## 2022-12-12 MED ORDER — SPIRONOLACTONE 100 MG PO TABS: 100.0000 mg | ORAL_TABLET | Freq: Every day | ORAL | 5 refills | Status: DC

## 2022-12-12 MED ORDER — FUROSEMIDE 40 MG PO TABS: 40.0000 mg | ORAL_TABLET | Freq: Every day | ORAL | 5 refills | Status: DC

## 2022-12-12 NOTE — Addendum Note (Signed)
Addended by: Armstead Peaks on: 12/12/2022 03:59 PM   Modules accepted: Orders

## 2022-12-12 NOTE — Telephone Encounter (Signed)
We will work on standing order.  I need patient to go today for bilateral lower extremity dopplers due to right greater than left lower extremity edema and positive DDimer.   Patient notes chronically right lower ext more swollen with history of bad leg fracture on that side but with Ddimer on recent blood test we need to rule out DVT.

## 2022-12-12 NOTE — Addendum Note (Signed)
Addended by: Tiffany Kocher on: 12/12/2022 01:06 PM   Modules accepted: Orders

## 2022-12-12 NOTE — Telephone Encounter (Signed)
Tammy R/Mindy Tell patient to hold diuretics the day of his paracentesis. Standing order for large volume paracentesis.  No more than 8 liters at a time.  No more frequent than once per week.   Albumin per protocol. Send fluid for cell count with diff each time   Tammy C Patient needs to change diuretics. I will send in lasix 40mg  daily and spironolactone 100mg  daily.  He will need repeat BMET in 10 days (in addition to labs planned in 8 weeks per result note).

## 2022-12-12 NOTE — Telephone Encounter (Signed)
Called pt. It would take him over 1 hour to get to radiology for doppler today. He is scheduled for 945am tomorrow 10/29. He is also scheduled for PARA Friday 11/1 at 12:45pm. Patient also aware needs repeat BMET in 10 days and his new diuretic instructions.

## 2022-12-12 NOTE — Addendum Note (Signed)
Addended by: Tiffany Kocher on: 12/12/2022 01:18 PM   Modules accepted: Orders

## 2022-12-12 NOTE — Telephone Encounter (Signed)
Communication noted.  

## 2022-12-12 NOTE — Telephone Encounter (Signed)
Pt's wife called stating that the radiologist told her to call and request standing orders for paracentesis to be put in that the pt will need it done 1 or 2 times before his appt here on 12/20/2022. Please advise.

## 2022-12-13 ENCOUNTER — Ambulatory Visit (HOSPITAL_COMMUNITY)
Admission: RE | Admit: 2022-12-13 | Discharge: 2022-12-13 | Disposition: A | Discharge status: Home / Self Care | Payer: No Typology Code available for payment source | Source: Ambulatory Visit | Attending: Gastroenterology | Admitting: Gastroenterology

## 2022-12-13 DIAGNOSIS — R7989 Other specified abnormal findings of blood chemistry: Secondary | ICD-10-CM | POA: Diagnosis present

## 2022-12-13 DIAGNOSIS — R600 Localized edema: Secondary | ICD-10-CM | POA: Diagnosis present

## 2022-12-15 ENCOUNTER — Ambulatory Visit: Payer: No Typology Code available for payment source | Admitting: Internal Medicine

## 2022-12-15 LAB — CULTURE, BODY FLUID W GRAM STAIN -BOTTLE: Culture: NO GROWTH

## 2022-12-16 ENCOUNTER — Ambulatory Visit (HOSPITAL_COMMUNITY)
Admission: RE | Admit: 2022-12-16 | Discharge: 2022-12-16 | Disposition: A | Discharge status: Home / Self Care | Payer: No Typology Code available for payment source | Source: Ambulatory Visit | Attending: Gastroenterology | Admitting: Gastroenterology

## 2022-12-16 ENCOUNTER — Encounter (HOSPITAL_COMMUNITY): Payer: Self-pay

## 2022-12-16 DIAGNOSIS — R188 Other ascites: Secondary | ICD-10-CM

## 2022-12-16 MED ORDER — ALBUMIN HUMAN 25 % IV SOLN
INTRAVENOUS | Status: AC
  Filled 2022-12-16: qty 200

## 2022-12-16 MED ORDER — LIDOCAINE HCL (PF) 2 % IJ SOLN: 10.0000 mL | Freq: Once | INTRAMUSCULAR | Status: DC

## 2022-12-16 MED ORDER — LIDOCAINE HCL (PF) 2 % IJ SOLN
INTRAMUSCULAR | Status: AC
  Filled 2022-12-16: qty 10

## 2022-12-16 MED ORDER — ALBUMIN HUMAN 25 % IV SOLN
0.0000 g | Freq: Once | INTRAVENOUS | Status: DC
  Filled 2022-12-16: qty 400

## 2022-12-20 ENCOUNTER — Other Ambulatory Visit: Payer: Self-pay

## 2022-12-20 DIAGNOSIS — K7460 Unspecified cirrhosis of liver: Secondary | ICD-10-CM

## 2022-12-23 ENCOUNTER — Ambulatory Visit (HOSPITAL_COMMUNITY)
Admission: RE | Admit: 2022-12-23 | Discharge: 2022-12-23 | Disposition: A | Discharge status: Home / Self Care | Payer: No Typology Code available for payment source | Source: Ambulatory Visit | Attending: Gastroenterology | Admitting: Gastroenterology

## 2022-12-23 VITALS — BP 129/76 | HR 84 | Temp 97.5°F | Resp 16

## 2022-12-23 DIAGNOSIS — K7460 Unspecified cirrhosis of liver: Secondary | ICD-10-CM | POA: Diagnosis present

## 2022-12-23 DIAGNOSIS — R188 Other ascites: Secondary | ICD-10-CM | POA: Insufficient documentation

## 2022-12-23 LAB — BODY FLUID CELL COUNT WITH DIFFERENTIAL
Eos, Fluid: 0 %
Lymphs, Fluid: 73 %
Monocyte-Macrophage-Serous Fluid: 19 % — ABNORMAL LOW (ref 50–90)
Neutrophil Count, Fluid: 8 % (ref 0–25)
Total Nucleated Cell Count, Fluid: 223 uL (ref 0–1000)

## 2022-12-23 NOTE — Procedures (Signed)
PROCEDURE SUMMARY:  Successful ultrasound guided paracentesis from the left lower quadrant.  Yielded 5.9 L of clear yellow fluid.  No immediate complications.  The patient tolerated the procedure well.   Specimen was sent for labs.  EBL < 5mL  The patient has required >/=2 paracenteses in a 30 day period and a screening evaluation by the [facility] has been arranged.  Lynnette Caffey, PA-C

## 2022-12-23 NOTE — Progress Notes (Signed)
Pt brought to [place] [facility] in no obvious distress. Did mention there was some SOB earllier. O2 sat 99%. Procedure explained, consent obtained. Prepped and draped in sterile manner. Local anesthetic admin withoutadverse reaction. Access obtained at [facility] , clear serous fluid retrieved, lab collected sent. 5.9L total retrieved. Access withdrawn without complication. Dermabond applied, bandage in place. No bleeding or hematoma.Escorted to waiting area in no acute distress.

## 2022-12-27 NOTE — Progress Notes (Unsigned)
GI Office Note    Referring Provider: Gareth Morgan, MD Primary Care Physician:  Gareth Morgan, MD  Primary Gastroenterologist: Roetta Sessions, MD   Chief Complaint   No chief complaint on file.   History of Present Illness   Billy Chung is a 62 y.o. male presenting today for follow up. Last seen 11/2021. 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   Last etoh 11/21/22.    Recent labs with MELD 3.0 of 21, immune to Hep A, mild anemia with normal iron/b12/folate. Ferritin elevated. Immunoglobulins and smooth muscle ab elevate. Recommedned hep b vaccinations, repeat labs in 8 weeks. Recommended follow up with PCP for elevated Ddimer since dopplers of lower extremitites were negative.   3 paracentesis since 10/23.   Medications   Current Outpatient Medications  Medication Sig Dispense Refill   furosemide (LASIX) 40 MG tablet Take 1 tablet (40 mg total) by mouth daily. 30 tablet 5   spironolactone (ALDACTONE) 100 MG tablet Take 1 tablet (100 mg total) by mouth daily. 30 tablet 5   No current facility-administered medications for this visit.    Allergies   Allergies as of 12/28/2022 - Review Complete 12/16/2022  Allergen Reaction Noted   No known allergies  11/04/2015     Past Medical History   Past Medical History:  Diagnosis Date   Alcohol abuse    Pneumonia     Past Surgical History   Past Surgical History:  Procedure Laterality Date   APPENDECTOMY     ORIF ANKLE FRACTURE Right 11/04/2015   Procedure: OPEN REDUCTION INTERNAL FIXATION (ORIF) ANKLE FRACTURE;  Surgeon: Nadara Mustard, MD;  Location: [facility] OR;  Service: Orthopedics;  Laterality: Right;    Past Family History   Family History  Problem Relation Age of Onset   Cirrhosis Maternal Grandmother        no etoh   Cirrhosis Maternal Aunt        no etoh   Colon cancer Neg Hx     Past Social History   Social History   Socioeconomic History    Marital status: Single    Spouse name: Not on file   Number of children: Not on file   Years of education: Not on file   Highest education level: Not on file  Occupational History   Not on file  Tobacco Use   Smoking status: Former   Smokeless tobacco: Former    Types: Engineer, drilling   Vaping status: Never Used  Substance and Sexual Activity   Alcohol use: Not Currently    Alcohol/week: 168.0 standard drinks of alcohol    Types: 168 Cans of beer per week    Comment: daily (stopped 11/24/22)   Drug use: Not Currently    Types: Cocaine    Comment: former drug use (2 years ago)   Sexual activity: Yes  Other Topics Concern   Not on file  Social History Narrative   Not on file   Social Determinants of Health   Financial Resource Strain: Not on file  Food Insecurity: Not on file  Transportation Needs: Not on file  Physical Activity: Not on file  Stress: Not on file  Social Connections: Not on file  Intimate Partner Violence: Not on file    Review of Systems   General: Negative for anorexia, weight loss, fever, chills, fatigue, weakness. ENT: Negative for hoarseness, difficulty swallowing , nasal congestion. CV: Negative for chest pain, angina, palpitations, dyspnea  on exertion, peripheral edema.  Respiratory: Negative for dyspnea at rest, dyspnea on exertion, cough, sputum, wheezing.  GI: See history of present illness. GU:  Negative for dysuria, hematuria, urinary incontinence, urinary frequency, nocturnal urination.  Endo: Negative for unusual weight change.     Physical Exam   There were no vitals taken for this visit.   General: Well-nourished, well-developed in no acute distress.  Eyes: No icterus. Mouth: Oropharyngeal mucosa moist and pink , no lesions erythema or exudate. Lungs: Clear to auscultation bilaterally.  Heart: Regular rate and rhythm, no murmurs rubs or gallops.  Abdomen: Bowel sounds are normal, nontender, nondistended, no hepatosplenomegaly  or masses,  no abdominal bruits or hernia , no rebound or guarding.  Rectal: ***  Extremities: No lower extremity edema. No clubbing or deformities. Neuro: Alert and oriented x 4   Skin: Warm and dry, no jaundice.   Psych: Alert and cooperative, normal mood and affect.  Labs   *** Imaging Studies   US Paracentesis  Result Date: 12/23/2022 INDICATION: Patient with history of cirrhosis, recurrent ascites. Request for diagnostic and therapeutic paracentesis. EXAM: ULTRASOUND GUIDED DIAGNOSTIC AND THERAPEUTIC PARACENTESIS MEDICATIONS: 8 mL 1% lidocaine COMPLICATIONS: None immediate. PROCEDURE: Informed written consent was obtained from the patient after a discussion of the risks, benefits and alternatives to treatment. A timeout was performed prior to the initiation of the procedure. Initial ultrasound scanning demonstrates a large amount of ascites within the left lower abdominal quadrant. The left lower abdomen was prepped and draped in the usual sterile fashion. 1% lidocaine was used for local anesthesia. Following this, a 19 gauge, 10-cm, Yueh catheter was introduced. An ultrasound image was saved for documentation purposes. The paracentesis was performed. The catheter was removed and a dressing was applied. The patient tolerated the procedure well without immediate post procedural complication. FINDINGS: A total of approximately 5.9 L of clear yellow fluid was removed. Samples were sent to the laboratory as requested by the clinical team. IMPRESSION: Successful ultrasound-guided paracentesis yielding 5.9 liters of peritoneal fluid. Performed by Lynnette Caffey, PA-C Electronically Signed   By: Acquanetta Belling M.D.   On: 12/23/2022 15:18   US Venous Img Lower Bilateral (DVT)  Result Date: 12/13/2022 CLINICAL DATA:  Chronic bilateral lower extremity edema and erythema, right greater than left. Elevated D-dimer. EXAM: BILATERAL LOWER EXTREMITY VENOUS DOPPLER ULTRASOUND TECHNIQUE: Gray-scale  sonography with graded compression, as well as color Doppler and duplex ultrasound were performed to evaluate the lower extremity deep venous systems from the level of the common femoral vein and including the common femoral, femoral, profunda femoral, popliteal and calf veins including the posterior tibial, peroneal and gastrocnemius veins when visible. The superficial great saphenous vein was also interrogated. Spectral Doppler was utilized to evaluate flow at rest and with distal augmentation maneuvers in the common femoral, femoral and popliteal veins. COMPARISON:  None Available. FINDINGS: RIGHT LOWER EXTREMITY Common Femoral Vein: No evidence of thrombus. Normal compressibility, respiratory phasicity and response to augmentation. Saphenofemoral Junction: No evidence of thrombus. Normal compressibility and flow on color Doppler imaging. Profunda Femoral Vein: No evidence of thrombus. Normal compressibility and flow on color Doppler imaging. Femoral Vein: No evidence of thrombus. Normal compressibility, respiratory phasicity and response to augmentation. Popliteal Vein: No evidence of thrombus. Normal compressibility, respiratory phasicity and response to augmentation. Calf Veins: No evidence of thrombus. Normal compressibility and flow on color Doppler imaging. Superficial Great Saphenous Vein: No evidence of thrombus. Normal compressibility. Venous Reflux:  None. Other Findings: No evidence of  superficial thrombophlebitis or abnormal fluid collection. LEFT LOWER EXTREMITY Common Femoral Vein: No evidence of thrombus. Normal compressibility, respiratory phasicity and response to augmentation. Saphenofemoral Junction: No evidence of thrombus. Normal compressibility and flow on color Doppler imaging. Profunda Femoral Vein: No evidence of thrombus. Normal compressibility and flow on color Doppler imaging. Femoral Vein: No evidence of thrombus. Normal compressibility, respiratory phasicity and response to  augmentation. Popliteal Vein: No evidence of thrombus. Normal compressibility, respiratory phasicity and response to augmentation. Calf Veins: No evidence of thrombus. Normal compressibility and flow on color Doppler imaging. Superficial Great Saphenous Vein: No evidence of thrombus. Normal compressibility. Venous Reflux:  None. Other Findings: No evidence of superficial thrombophlebitis or abnormal fluid collection. IMPRESSION: No evidence of deep venous thrombosis in either lower extremity. Electronically Signed   By: Irish Lack M.D.   On: 12/13/2022 10:34   US Paracentesis  Result Date: 12/09/2022 INDICATION: Cirrhosis and ascites. EXAM: ULTRASOUND GUIDED DIAGNOSTIC AND THERAPEUTIC PARACENTESIS MEDICATIONS: 10 mL 1% lidocaine. COMPLICATIONS: None immediate. PROCEDURE: Informed written consent was obtained from the patient after a discussion of the risks, benefits and alternatives to treatment. A timeout was performed prior to the initiation of the procedure. Initial ultrasound scanning demonstrates a large amount of ascites within the right lower abdominal quadrant. The right lower abdomen was prepped and draped in the usual sterile fashion. 1% lidocaine was used for local anesthesia. Following this, a 19 gauge, 7-cm, Yueh catheter was introduced. An ultrasound image was saved for documentation purposes. The paracentesis was performed. The catheter was removed and a dressing was applied. The patient tolerated the procedure well without immediate post procedural complication. FINDINGS: A total of approximately 5.5 L of clear yellow fluid was removed. Samples were sent to the laboratory as requested by the clinical team. IMPRESSION: Successful ultrasound-guided paracentesis yielding 5.5 liters of peritoneal fluid. Electronically Signed   By: Obie Dredge M.D.   On: 12/09/2022 15:23   US Paracentesis  Result Date: 12/07/2022 INDICATION: Patient with history of ETOH cirrhosis, new onset ascites.  Request for diagnostic and therapeutic paracentesis with 4.0 L maximum. EXAM: ULTRASOUND GUIDED DIAGNOSTIC AND THERAPEUTIC PARACENTESIS MEDICATIONS: 8 mL 1% lidocaine COMPLICATIONS: None immediate. PROCEDURE: Informed written consent was obtained from the patient after a discussion of the risks, benefits and alternatives to treatment. A timeout was performed prior to the initiation of the procedure. Initial ultrasound scanning demonstrates a large amount of ascites within the left lower abdominal quadrant. The left lower abdomen was prepped and draped in the usual sterile fashion. 1% lidocaine was used for local anesthesia. Following this, a 19 gauge, 7-cm, Yueh catheter was introduced. An ultrasound image was saved for documentation purposes. The paracentesis was performed. The catheter was removed and a dressing was applied. The patient tolerated the procedure well without immediate post procedural complication. Patient received post-procedure intravenous albumin; see nursing notes for details. FINDINGS: A total of approximately 4.0 L of clear yellow fluid was removed. Samples were sent to the laboratory as requested by the clinical team. IMPRESSION: Successful ultrasound-guided paracentesis yielding 4.0 liters of peritoneal fluid. Performed by Lynnette Caffey, PA-C PLAN: If the patient eventually requires >/=2 paracenteses in a 30 day period, candidacy for formal evaluation by the [facility] Interventional Radiology Portal Hypertension Clinic will be assessed. Electronically Signed   By: Acquanetta Belling M.D.   On: 12/07/2022 15:47   CT ABDOMEN PELVIS W CONTRAST  Result Date: 12/03/2022 CLINICAL DATA:  Elevated bilirubin.  Abdominal distension. EXAM: CT ABDOMEN AND PELVIS WITH CONTRAST  TECHNIQUE: Multidetector CT imaging of the abdomen and pelvis was performed using the standard protocol following bolus administration of intravenous contrast. RADIATION DOSE REDUCTION: This exam was performed according to the  departmental dose-optimization program which includes automated exposure control, adjustment of the mA and/or kV according to patient size and/or use of iterative reconstruction technique. CONTRAST:  OMNIPAQUE IOHEXOL 300 MG/ML  SOLN COMPARISON:  Abdominal ultrasound 05/11/2021 FINDINGS: Lower chest: The heart is mildly enlarged. Trace left pleural effusion. No focal airspace disease. Hepatobiliary: Hepatic cirrhosis with nodular contours. Geographic areas of more decreased density suggest focal fatty deposition but are nonspecific. No obvious focal lesion. Punctate gallstones within partially distended gallbladder. No obvious gallbladder inflammation. No biliary dilatation. The portal vein is patent. Pancreas: No ductal dilatation or inflammation. Spleen: Mild splenomegaly, spleen measures 14.2 cm AP. Calcified granuloma but no focal lesion. Adrenals/Urinary Tract: No adrenal nodule. No hydronephrosis. No renal calculi. Subcentimeter low-density lesions in the lower right kidney are too small to characterize, favor cysts. No further follow-up imaging is recommended. Urinary bladder is near completely decompressed. Stomach/Bowel: Bowel assessment is limited in the presence of ascites as well as mild motion artifact. No bowel obstruction. No definite bowel inflammation. Small to moderate volume of stool in the colon. Left colonic diverticula without diverticulitis. The appendix is not confidently seen. Vascular/Lymphatic: Patent portal vein. Splenic vein is patent. Aortic and branch atherosclerosis, no aneurysm. Scattered porta hepatis nodes are typically reactive. No suspicious lymphadenopathy. Reproductive: Prostate is unremarkable. Other: Moderate to large volume abdominopelvic ascites. Ascitic fluid measures simple fluid density. There is generalized edema of the mesenteric and intra-abdominal fat. Strandy density in the omentum likely represents sequela of ascites. Mild skin thickening in the lower  abdominal pannus with generalized subcutaneous edema. No free intra-abdominal air. Tiny fat containing umbilical hernia. Musculoskeletal: Diffuse thoracic spondylosis. There are no acute or suspicious osseous abnormalities. IMPRESSION: 1. Hepatic cirrhosis with moderate to large volume abdominopelvic ascites. Mild splenomegaly. 2. Cholelithiasis without CT findings of acute cholecystitis. 3. Colonic diverticulosis without diverticulitis. 4. Trace left pleural effusion. Aortic Atherosclerosis (ICD10-I70.0). Electronically Signed   By: Narda Rutherford M.D.   On: 12/03/2022 19:05   DG Chest 2 View  Result Date: 12/03/2022 CLINICAL DATA:  Shortness of breath. EXAM: CHEST - 2 VIEW COMPARISON:  03/09/2021. FINDINGS: Low lung volume. Bilateral lung fields are clear. Bilateral costophrenic angles are clear. Normal cardio-mediastinal silhouette. No acute osseous abnormalities. The soft tissues are within normal limits. IMPRESSION: No active cardiopulmonary disease. Electronically Signed   By: Jules Schick M.D.   On: 12/03/2022 12:59    Assessment       PLAN   ***   Leanna Battles. Melvyn Neth, MHS, PA-C [facility]

## 2022-12-28 ENCOUNTER — Ambulatory Visit (INDEPENDENT_AMBULATORY_CARE_PROVIDER_SITE_OTHER): Payer: No Typology Code available for payment source | Admitting: Gastroenterology

## 2022-12-28 ENCOUNTER — Encounter: Payer: Self-pay | Admitting: Gastroenterology

## 2022-12-28 ENCOUNTER — Other Ambulatory Visit: Payer: Self-pay

## 2022-12-28 ENCOUNTER — Telehealth: Payer: Self-pay | Admitting: Gastroenterology

## 2022-12-28 VITALS — BP 131/80 | HR 69 | Temp 98.0°F | Ht 68.0 in | Wt 225.0 lb

## 2022-12-28 DIAGNOSIS — I517 Cardiomegaly: Secondary | ICD-10-CM | POA: Diagnosis not present

## 2022-12-28 DIAGNOSIS — R188 Other ascites: Secondary | ICD-10-CM

## 2022-12-28 DIAGNOSIS — K5900 Constipation, unspecified: Secondary | ICD-10-CM

## 2022-12-28 DIAGNOSIS — F1010 Alcohol abuse, uncomplicated: Secondary | ICD-10-CM

## 2022-12-28 DIAGNOSIS — K7031 Alcoholic cirrhosis of liver with ascites: Secondary | ICD-10-CM

## 2022-12-28 NOTE — Telephone Encounter (Signed)
Opened in error

## 2022-12-28 NOTE — Patient Instructions (Signed)
Continue with your sobriety! This will give your liver the best chance to work better.  Limit your salt intake, too much salt with make you retain more fluid. See handout. Limit to 2 grams of salt daily.  Update your labs at [facility] first week of December or sooner if you are feeling worse. We will send in standing orders for fluid to be drawn off your abdomen as needed. You will need an upper endoscopy in the near future to screen for blood vessels that can grow around your esophagus and stomach and can rupture or bleeding. This is sometimes a problem with people with cirrhosis. We will discuss after your next labs. Monitor for black or bloody stools.  Get an appointment with a primary care provider, try Dr. Delmar Landau at [facility].  For constipation, please take miralax 1-2 capfuls daily. If not effective, you can take Linzess once daily as needed but please take when you have access to a bathroom. Samples provided.  Please reach out with any questions or concerns.

## 2022-12-28 NOTE — Telephone Encounter (Signed)
Pt was made aware and verbalized understanding.  

## 2022-12-28 NOTE — Telephone Encounter (Signed)
Tammy, please let pt or dgt know that I did not realize he had not gotten the bmet done after adding the second fluid pill. I would prefer he go get labs done sooner then first week of December. Preferable next week or with his next paracentesis.

## 2023-01-05 LAB — IGG, IGA, IGM
IgA/Immunoglobulin A, Serum: 1039 mg/dL — ABNORMAL HIGH (ref 61–437)
IgG (Immunoglobin G), Serum: 2191 mg/dL — ABNORMAL HIGH (ref 603–1613)
IgM (Immunoglobulin M), Srm: 209 mg/dL — ABNORMAL HIGH (ref 20–172)

## 2023-01-05 LAB — COMPREHENSIVE METABOLIC PANEL
ALT: 26 [IU]/L (ref 0–44)
AST: 51 [IU]/L — ABNORMAL HIGH (ref 0–40)
Albumin: 2.4 g/dL — ABNORMAL LOW (ref 3.9–4.9)
Alkaline Phosphatase: 121 [IU]/L (ref 44–121)
BUN/Creatinine Ratio: 10 (ref 10–24)
BUN: 10 mg/dL (ref 8–27)
Bilirubin Total: 3.1 mg/dL — ABNORMAL HIGH (ref 0.0–1.2)
CO2: 22 mmol/L (ref 20–29)
Calcium: 8.2 mg/dL — ABNORMAL LOW (ref 8.6–10.2)
Chloride: 100 mmol/L (ref 96–106)
Creatinine, Ser: 0.97 mg/dL (ref 0.76–1.27)
Globulin, Total: 4.1 g/dL (ref 1.5–4.5)
Glucose: 98 mg/dL (ref 70–99)
Potassium: 4.1 mmol/L (ref 3.5–5.2)
Sodium: 134 mmol/L (ref 134–144)
Total Protein: 6.5 g/dL (ref 6.0–8.5)
eGFR: 87 mL/min/{1.73_m2} (ref >59)

## 2023-01-05 LAB — CBC WITH DIFFERENTIAL/PLATELET
Basophils Absolute: 0 10*3/uL (ref 0.0–0.2)
Basos: 1 %
EOS (ABSOLUTE): 0.1 10*3/uL (ref 0.0–0.4)
Eos: 1 %
Hematocrit: 35.6 % — ABNORMAL LOW (ref 37.5–51.0)
Hemoglobin: 12.4 g/dL — ABNORMAL LOW (ref 13.0–17.7)
Immature Grans (Abs): 0 10*3/uL (ref 0.0–0.1)
Immature Granulocytes: 1 %
Lymphocytes Absolute: 1.9 10*3/uL (ref 0.7–3.1)
Lymphs: 23 %
MCH: 34.1 pg — ABNORMAL HIGH (ref 26.6–33.0)
MCHC: 34.8 g/dL (ref 31.5–35.7)
MCV: 98 fL — ABNORMAL HIGH (ref 79–97)
Monocytes Absolute: 0.9 10*3/uL (ref 0.1–0.9)
Monocytes: 11 %
Neutrophils Absolute: 5.3 10*3/uL (ref 1.4–7.0)
Neutrophils: 63 %
Platelets: 212 10*3/uL (ref 150–450)
RBC: 3.64 x10E6/uL — ABNORMAL LOW (ref 4.14–5.80)
RDW: 12.8 % (ref 11.6–15.4)
WBC: 8.3 10*3/uL (ref 3.4–10.8)

## 2023-01-05 LAB — ANTI-SMOOTH MUSCLE ANTIBODY, IGG: Smooth Muscle Ab: 104 U — ABNORMAL HIGH (ref 0–19)

## 2023-01-05 LAB — PROTIME-INR
INR: 1.3 — ABNORMAL HIGH (ref 0.9–1.2)
Prothrombin Time: 14.6 s — ABNORMAL HIGH (ref 9.1–12.0)

## 2023-01-05 LAB — FERRITIN: Ferritin: 487 ng/mL — ABNORMAL HIGH (ref 30–400)

## 2023-01-13 ENCOUNTER — Encounter (HOSPITAL_COMMUNITY): Payer: Self-pay

## 2023-01-13 ENCOUNTER — Ambulatory Visit (HOSPITAL_COMMUNITY)
Admission: RE | Admit: 2023-01-13 | Discharge: 2023-01-13 | Disposition: A | Discharge status: Home / Self Care | Payer: No Typology Code available for payment source | Source: Ambulatory Visit | Attending: Gastroenterology | Admitting: Gastroenterology

## 2023-01-13 DIAGNOSIS — R188 Other ascites: Secondary | ICD-10-CM | POA: Insufficient documentation

## 2023-01-13 LAB — BODY FLUID CELL COUNT WITH DIFFERENTIAL
Eos, Fluid: 0 %
Lymphs, Fluid: 38 %
Monocyte-Macrophage-Serous Fluid: 60 % (ref 50–90)
Neutrophil Count, Fluid: 2 % (ref 0–25)
Total Nucleated Cell Count, Fluid: 224 uL (ref 0–1000)

## 2023-01-13 MED ORDER — ALBUMIN HUMAN 25 % IV SOLN
INTRAVENOUS | Status: AC
  Filled 2023-01-13: qty 200

## 2023-01-13 MED ORDER — ALBUMIN HUMAN 25 % IV SOLN
50.0000 g | Freq: Once | INTRAVENOUS | Status: AC
  Administered 2023-01-13: 50 g via INTRAVENOUS

## 2023-01-13 NOTE — Progress Notes (Signed)
Patient tolerated right sided paracentesis and 50G of IV albumin well today and 8 Liters of clear yellow ascites removed with labs collected and sent for processing. Patient verbalized understanding of discharge instructions and ambulatory at departure with no acute distress noted.

## 2023-01-16 LAB — PATHOLOGIST SMEAR REVIEW: Path Review: REACTIVE

## 2023-01-20 NOTE — Progress Notes (Signed)
   Portal Hypertension Clinic Screening Evaluation   Indication for evaluation: Billy Chung is a 62 y.o. male undergoing preliminary evaluation in the [facility] Interventional Radiology Portal Hypertension Clinic due to recurrent ascites.  Referring Physician/Established Gastroenterologist:  Dr. Rosalin Hawking, PA-C  Etiology of cirrhosis: EtOH Initially diagnosed: 11/2022 # of paracentesis in last month: 2 # of paracentesis in last 2 months: 4 History of hepatic hydrothorax:  no History of hepatic encephalopathy: no  Prior evaluation for liver transplant: no History of hepatocellular carcinoma: no  Prior esophagogastroduodenoscopy/intervention: none Current esophageal varices: unknown Current gastric varices: unknown, none significant on CT History of hematemesis: no  Current diuretic regimen: furosemide 40 mg every day, spironolactone 100 mg QD Current pharmacologic encephalopathy prophylaxis/treatment: none  History of renal dysfunction: no History of hemodialysis: no  History of cardiac dysfunction: no  Other pertinent past medical history: none significant   Imaging: Prior cross sectional imaging of portal system: CT AP 12/03/22  Patent portal system, no significant varices or portosystemic shunts. Cirrhosis, ascites.   Echocardiogram:  05/20/21  IMPRESSIONS    1. Left ventricular ejection fraction, by estimation, is 60 to 65%. The left ventricle has normal function. The left ventricle has no regional wall motion abnormalities. There is mild concentric left ventricular hypertrophy. Left ventricular diastolic parameters are consistent with Grade I diastolic dysfunction (impaired relaxation).  2. Right ventricular systolic function is normal. The right ventricular size is normal. There is normal pulmonary artery systolic pressure. The estimated right ventricular systolic pressure is 27.0 mmHg.  3. The mitral valve is grossly normal. Trivial  mitral valve regurgitation. Moderate mitral annular calcification.  4. The aortic valve is tricuspid. There is mild calcification of the aortic valve. There is mild thickening of the aortic valve. Aortic valve regurgitation is not visualized.  5. Aortic dilatation noted. There is borderline dilatation of the ascending aorta, measuring 37 mm.  6. The inferior vena cava is normal in size with <50% respiratory variability, suggesting right atrial pressure of 8 mmHg.     Labs: 01/04/23 Creatinine: 0.97  Total Bilirubin: 3.1 INR: 1.3 Sodium: 134 Albumin: 2.4  Child-Pugh = 11 points, class C MELD = 14 (6.0% estimated 3 month mortality after TIPS) [place] Index of Post-TIPS Survival (FIPS) = 0.55 (overall survival after TIPS estimated at 1 month 92.3%, 3 months 76.2%, and 6 months 66.3%)    Assessment: Billy Chung is a 62 y.o. 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  After preliminary evaluation, this patient would be a possible candidate for TIPS creation to manage ascites, particularly if maximization of diuresis is unsuccessful or limited by renal function.  Recommendation: Formal consult for TIPS creation could be considered.  The patient's Gastroenterologist, Dr. Rosalin Hawking, PA-C, will be contacted for further discussion.    Electronically Signed: Bennie Dallas, MD 01/20/2023, 8:00 AM

## 2023-01-23 LAB — ACID FAST CULTURE WITH REFLEXED SENSITIVITIES (MYCOBACTERIA): Acid Fast Culture: NEGATIVE

## 2023-01-24 NOTE — Progress Notes (Signed)
Left patient a message to call the office to get scheduled with Dr. Jena Gauss

## 2023-01-27 ENCOUNTER — Ambulatory Visit (HOSPITAL_COMMUNITY)
Admission: RE | Admit: 2023-01-27 | Discharge: 2023-01-27 | Disposition: A | Discharge status: Home / Self Care | Payer: No Typology Code available for payment source | Source: Ambulatory Visit | Attending: Gastroenterology | Admitting: Gastroenterology

## 2023-01-27 ENCOUNTER — Encounter (HOSPITAL_COMMUNITY): Payer: Self-pay

## 2023-01-27 DIAGNOSIS — R188 Other ascites: Secondary | ICD-10-CM | POA: Diagnosis present

## 2023-01-27 LAB — BODY FLUID CELL COUNT WITH DIFFERENTIAL
Eos, Fluid: 0 %
Lymphs, Fluid: 50 %
Monocyte-Macrophage-Serous Fluid: 46 % — ABNORMAL LOW (ref 50–90)
Neutrophil Count, Fluid: 4 % (ref 0–25)
Total Nucleated Cell Count, Fluid: 189 uL (ref 0–1000)

## 2023-01-27 MED ORDER — ALBUMIN HUMAN 25 % IV SOLN
INTRAVENOUS | Status: AC
Start: 2023-01-27 — End: ?
  Filled 2023-01-27: qty 100

## 2023-01-27 MED ORDER — ALBUMIN HUMAN 25 % IV SOLN
50.0000 g | Freq: Once | INTRAVENOUS | Status: AC
  Administered 2023-01-27: 50 g via INTRAVENOUS

## 2023-01-27 NOTE — Progress Notes (Signed)
Patient tolerated left sided paracentesis and 50G of IV albumin well today and 8 Liters of clear yellow ascites removed with labs collected and sent for processing. Patient verbalized understanding of discharge instructions and ambulatory at departure with no acute distress noted.

## 2023-02-17 ENCOUNTER — Ambulatory Visit (HOSPITAL_COMMUNITY)
Admission: RE | Admit: 2023-02-17 | Discharge: 2023-02-17 | Disposition: A | Discharge status: Home / Self Care | Payer: No Typology Code available for payment source | Source: Ambulatory Visit | Attending: Gastroenterology | Admitting: Gastroenterology

## 2023-02-17 ENCOUNTER — Encounter (HOSPITAL_COMMUNITY): Payer: Self-pay

## 2023-02-17 DIAGNOSIS — K7031 Alcoholic cirrhosis of liver with ascites: Secondary | ICD-10-CM | POA: Diagnosis present

## 2023-02-17 DIAGNOSIS — R188 Other ascites: Secondary | ICD-10-CM

## 2023-02-17 LAB — BODY FLUID CELL COUNT WITH DIFFERENTIAL
Eos, Fluid: 0 %
Lymphs, Fluid: 62 %
Monocyte-Macrophage-Serous Fluid: 33 % — ABNORMAL LOW (ref 50–90)
Neutrophil Count, Fluid: 5 % (ref 0–25)
Total Nucleated Cell Count, Fluid: 187 uL (ref 0–1000)

## 2023-02-17 MED ORDER — ALBUMIN HUMAN 25 % IV SOLN
50.0000 g | Freq: Once | INTRAVENOUS | Status: AC
  Administered 2023-02-17: 50 g via INTRAVENOUS

## 2023-02-17 MED ORDER — ALBUMIN HUMAN 25 % IV SOLN
INTRAVENOUS | Status: AC
  Filled 2023-02-17: qty 200

## 2023-02-17 NOTE — Procedures (Signed)
 PROCEDURE SUMMARY:  Successful ultrasound guided paracentesis from the right lower quadrant.  Yielded 8.0 L of clear yellow fluid.  No immediate complications.  The patient tolerated the procedure well.   Specimen was sent for labs.  EBL < 5mL  The patient has previously been formally evaluated by the [facility] Interventional Radiology Portal Hypertension Clinic and is being actively followed for potential future intervention.  Per Dr. Terrill evaluation 01/13/23: Assessment: Billy Chung is a 63 y.o. 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  After preliminary evaluation, this patient would be a possible candidate for TIPS creation to manage ascites, particularly if maximization of diuresis is unsuccessful or limited by renal function.   Recommendation: Formal consult for TIPS creation could be considered.  The patient's Gastroenterologist, Dr. Andrez Kerns, PA-C, will be contacted for further discussion.  Clotilda Hesselbach, PA-C

## 2023-02-17 NOTE — Progress Notes (Signed)
 Patient tolerated right sided paracentesis procedure and 50G of IV albumin well today and 8 Liters of clear yellow ascites removed with labs collected and sent for processing. Patient verbalized understanding of discharge instructions and ambulatory at departure with no acute distress noted.

## 2023-02-20 ENCOUNTER — Other Ambulatory Visit: Payer: Self-pay

## 2023-02-20 ENCOUNTER — Other Ambulatory Visit: Payer: Self-pay | Admitting: Gastroenterology

## 2023-02-20 DIAGNOSIS — R188 Other ascites: Secondary | ICD-10-CM

## 2023-02-20 MED ORDER — FUROSEMIDE 40 MG PO TABS: ORAL_TABLET | ORAL | 5 refills | Status: DC

## 2023-02-20 MED ORDER — SPIRONOLACTONE 100 MG PO TABS: ORAL_TABLET | ORAL | 5 refills | Status: DC

## 2023-02-21 ENCOUNTER — Other Ambulatory Visit: Payer: Self-pay | Admitting: *Deleted

## 2023-02-21 DIAGNOSIS — R188 Other ascites: Secondary | ICD-10-CM

## 2023-02-24 ENCOUNTER — Other Ambulatory Visit (HOSPITAL_COMMUNITY)
Admission: RE | Admit: 2023-02-24 | Discharge: 2023-02-24 | Disposition: A | Discharge status: Home / Self Care | Payer: No Typology Code available for payment source | Source: Ambulatory Visit | Attending: Gastroenterology | Admitting: Gastroenterology

## 2023-02-24 ENCOUNTER — Other Ambulatory Visit: Payer: Self-pay | Admitting: Gastroenterology

## 2023-02-24 ENCOUNTER — Ambulatory Visit (HOSPITAL_COMMUNITY)
Admission: RE | Admit: 2023-02-24 | Discharge: 2023-02-24 | Disposition: A | Discharge status: Home / Self Care | Payer: No Typology Code available for payment source | Source: Ambulatory Visit | Attending: Gastroenterology | Admitting: Gastroenterology

## 2023-02-24 ENCOUNTER — Encounter (HOSPITAL_COMMUNITY): Payer: Self-pay

## 2023-02-24 DIAGNOSIS — R188 Other ascites: Secondary | ICD-10-CM | POA: Diagnosis present

## 2023-02-24 DIAGNOSIS — R300 Dysuria: Secondary | ICD-10-CM

## 2023-02-24 LAB — BASIC METABOLIC PANEL
Anion gap: 7 (ref 5–15)
BUN: 10 mg/dL (ref 8–23)
CO2: 23 mmol/L (ref 22–32)
Calcium: 8.6 mg/dL — ABNORMAL LOW (ref 8.9–10.3)
Chloride: 103 mmol/L (ref 98–111)
Creatinine, Ser: 0.8 mg/dL (ref 0.61–1.24)
GFR, Estimated: >60 mL/min (ref >60)
Glucose, Bld: 95 mg/dL (ref 70–99)
Potassium: 3.7 mmol/L (ref 3.5–5.1)
Sodium: 133 mmol/L — ABNORMAL LOW (ref 135–145)

## 2023-02-24 LAB — BODY FLUID CELL COUNT WITH DIFFERENTIAL
Eos, Fluid: 0 %
Lymphs, Fluid: 50 %
Monocyte-Macrophage-Serous Fluid: 37 % — ABNORMAL LOW (ref 50–90)
Neutrophil Count, Fluid: 15 % (ref 0–25)
Total Nucleated Cell Count, Fluid: 203 uL (ref 0–1000)

## 2023-02-24 MED ORDER — ALBUMIN HUMAN 25 % IV SOLN
0.0000 g | Freq: Once | INTRAVENOUS | Status: AC
  Administered 2023-02-24: 50 g via INTRAVENOUS

## 2023-02-24 MED ORDER — ALBUMIN HUMAN 25 % IV SOLN
INTRAVENOUS | Status: AC
  Filled 2023-02-24: qty 200

## 2023-02-24 NOTE — Progress Notes (Signed)
 Patient tolerated left sided paracentesis and 50 G of IV albumin  well today and 6.9 Liters of clear yellow ascites removed with labs collected and sent for processing. Patient complaining of painful urination for the past 4 days and new order obtained from GI, PA for urinalysis today after paracentesis complete. Patient verbalized understanding of discharge instructions and ambulatory at departure with no acute distress noted.

## 2023-02-25 LAB — URINE CULTURE: Culture: 40000 — AB

## 2023-02-27 NOTE — Addendum Note (Signed)
 Addended by: Zada Finders on: 02/27/2023 04:49 PM   Modules accepted: Orders

## 2023-02-28 ENCOUNTER — Encounter: Payer: Self-pay | Admitting: Internal Medicine

## 2023-02-28 ENCOUNTER — Ambulatory Visit (INDEPENDENT_AMBULATORY_CARE_PROVIDER_SITE_OTHER): Payer: No Typology Code available for payment source | Admitting: Internal Medicine

## 2023-02-28 VITALS — BP 98/57 | HR 70 | Ht 68.0 in | Wt 204.0 lb

## 2023-02-28 DIAGNOSIS — E66811 Obesity, class 1: Secondary | ICD-10-CM | POA: Diagnosis not present

## 2023-02-28 DIAGNOSIS — R300 Dysuria: Secondary | ICD-10-CM | POA: Insufficient documentation

## 2023-02-28 DIAGNOSIS — F1010 Alcohol abuse, uncomplicated: Secondary | ICD-10-CM

## 2023-02-28 DIAGNOSIS — K7031 Alcoholic cirrhosis of liver with ascites: Secondary | ICD-10-CM

## 2023-02-28 NOTE — Assessment & Plan Note (Signed)
 Fairly new diagnosis.  Alcoholic cirrhosis requiring frequent paracenteses.  Currently prescribed Lasix  and Aldactone .  Positive fluid wave noted on exam today.  He reports that he will undergo repeat paracentesis later this week.  GI follow-up is scheduled for next week (1/21).  Denies alcohol consumption since October 2024.

## 2023-02-28 NOTE — Patient Instructions (Signed)
 It was a pleasure to see you today.  Thank you for giving us  the opportunity to be involved in your care.  Below is a brief recap of your visit and next steps.  We will plan to see you again in 6 months.  Summary You have established care today We will check basic labs Follow up in 6 months for routine care

## 2023-02-28 NOTE — Progress Notes (Signed)
 New Patient Office Visit  Subjective    Patient ID: Billy Chung, male    DOB: Mar 28, 1960  Age: 63 y.o. MRN: [id number]  CC:  Chief Complaint  Patient presents with   Establish Care   HPI Billy Chung presents to establish care.  He is a 63 year old male with a previously documented past medical history significant for alcoholic cirrhosis requiring frequent paracenteses.  Previously followed by Dr. Knowlton. Mr. Billy Chung reports feeling fairly well today. His acute concern is dysuria that has been present since a urinary catheter was temporarily placed in October.  Symptoms have recently worsened.  His gastroenterologist planned to check a UA later today and he would like to know if he can complete the study in our office.  He otherwise endorses chronic right ankle pain.  He currently works as a nutritional therapist.  He endorses former tobacco use, quitting in 1996.  Denies illicit drug use.  He endorses a history of alcohol abuse but has abstained from alcohol since October 2024.  Family medical history is significant for cirrhosis, diabetes mellitus, and CAD.  Acute concerns, chronic medical conditions, and outstanding preventative care items discussed today are individually addressed in A/P below.  Outpatient Encounter Medications as of 02/28/2023  Medication Sig   Cholecalciferol (VITAMIN D3) 50 MCG (2000 UT) capsule Take 2,000 Units by mouth daily.   furosemide  (LASIX ) 40 MG tablet Take 40mg  in am and 20mg  in pm   polyethylene glycol powder (GLYCOLAX/MIRALAX) 17 GM/SCOOP powder Take 1 Container by mouth daily as needed.   spironolactone  (ALDACTONE ) 100 MG tablet Take 100mg  in am and 50mg  in pm   No facility-administered encounter medications on file as of 02/28/2023.   Past Medical History:  Diagnosis Date   Alcohol abuse    Cirrhosis (HCC)    Pneumonia    Past Surgical History:  Procedure Laterality Date   APPENDECTOMY     ORIF ANKLE FRACTURE Right 11/04/2015   Procedure: OPEN  REDUCTION INTERNAL FIXATION (ORIF) ANKLE FRACTURE;  Surgeon: Jerona LULLA Sage, MD;  Location: [facility] OR;  Service: Orthopedics;  Laterality: Right;   Family History  Problem Relation Age of Onset   Cirrhosis Maternal Grandmother        no etoh   Cirrhosis Maternal Aunt        no etoh   Colon cancer Neg Hx    Social History   Socioeconomic History   Marital status: Single    Spouse name: Not on file   Number of children: Not on file   Years of education: Not on file   Highest education level: Not on file  Occupational History   Not on file  Tobacco Use   Smoking status: Former   Smokeless tobacco: Former    Types: Designer, Multimedia Use   Vaping status: Never Used  Substance and Sexual Activity   Alcohol use: Not Currently    Alcohol/week: 168.0 standard drinks of alcohol    Types: 168 Cans of beer per week    Comment: daily (stopped 11/24/22)   Drug use: Not Currently    Types: Cocaine    Comment: former drug use (2 years ago)   Sexual activity: Yes  Other Topics Concern   Not on file  Social History Narrative   Not on file   Social Drivers of Health   Financial Resource Strain: Not on file  Food Insecurity: Not on file  Transportation Needs: Not on file  Physical Activity: Not on file  Stress: Not on file  Social Connections: Not on file  Intimate Partner Violence: Not on file   Review of Systems  Genitourinary:  Positive for dysuria.  Musculoskeletal:  Positive for joint pain (chronic right ankle pain).  All other systems reviewed and are negative.  Objective    BP (!) 98/57 (BP Location: Left Arm, Patient Position: Sitting, Cuff Size: Normal)   Pulse 70   Ht 5' 8 (1.727 m)   Wt 204 lb (92.5 kg)   SpO2 96%   BMI 31.02 kg/m   Physical Exam Vitals reviewed.  Constitutional:      General: He is not in acute distress.    Appearance: Normal appearance. He is not ill-appearing.  HENT:     Head: Normocephalic and atraumatic.     Right Ear: External ear normal.      Left Ear: External ear normal.     Nose: Nose normal. No congestion or rhinorrhea.     Mouth/Throat:     Mouth: Mucous membranes are moist.     Pharynx: Oropharynx is clear.  Eyes:     General: No scleral icterus.    Extraocular Movements: Extraocular movements intact.     Conjunctiva/sclera: Conjunctivae normal.     Pupils: Pupils are equal, round, and reactive to light.  Cardiovascular:     Rate and Rhythm: Normal rate and regular rhythm.     Pulses: Normal pulses.     Heart sounds: Normal heart sounds. No murmur heard. Pulmonary:     Effort: Pulmonary effort is normal.     Breath sounds: Normal breath sounds. No wheezing, rhonchi or rales.  Abdominal:     General: Bowel sounds are normal. There is no distension.     Tenderness: There is no abdominal tenderness.     Comments: Protuberant abdomen with positive fluid wave  Musculoskeletal:        General: No swelling or deformity. Normal range of motion.     Cervical back: Normal range of motion.  Skin:    General: Skin is warm and dry.     Capillary Refill: Capillary refill takes less than 2 seconds.  Neurological:     General: No focal deficit present.     Mental Status: He is alert and oriented to person, place, and time.     Motor: No weakness.  Psychiatric:        Mood and Affect: Mood normal.        Behavior: Behavior normal.        Thought Content: Thought content normal.   Last CBC Lab Results  Component Value Date   WBC 8.3 01/04/2023   HGB 12.4 (L) 01/04/2023   HCT 35.6 (L) 01/04/2023   MCV 98 (H) 01/04/2023   MCH 34.1 (H) 01/04/2023   RDW 12.8 01/04/2023   PLT 212 01/04/2023   Last metabolic panel Lab Results  Component Value Date   GLUCOSE 95 02/24/2023   NA 133 (L) 02/24/2023   K 3.7 02/24/2023   CL 103 02/24/2023   CO2 23 02/24/2023   BUN 10 02/24/2023   CREATININE 0.80 02/24/2023   GFRNONAA >60 02/24/2023   CALCIUM 8.6 (L) 02/24/2023   PROT 6.5 01/04/2023   ALBUMIN  2.4 (L) 01/04/2023    LABGLOB 4.1 01/04/2023   BILITOT 3.1 (H) 01/04/2023   ALKPHOS 121 01/04/2023   AST 51 (H) 01/04/2023   ALT 26 01/04/2023   ANIONGAP 7 02/24/2023   Last lipids Lab Results  Component Value Date  CHOL 177 05/05/2021   HDL 64 05/05/2021   LDLCALC 97 05/05/2021   TRIG 79 05/05/2021   CHOLHDL 2.8 05/05/2021   Last hemoglobin A1c Lab Results  Component Value Date   HGBA1C 5.4 05/05/2021   Last vitamin B12 and Folate Lab Results  Component Value Date   VITAMINB12 >2000 (H) 12/07/2022   FOLATE 4.5 12/07/2022   Assessment & Plan:   Problem List Items Addressed This Visit       Cirrhosis (HCC) - Primary   Fairly new diagnosis.  Alcoholic cirrhosis requiring frequent paracenteses.  Currently prescribed Lasix  and Aldactone .  Positive fluid wave noted on exam today.  He reports that he will undergo repeat paracentesis later this week.  GI follow-up is scheduled for next week (1/21).  Denies alcohol consumption since October 2024.      Dysuria   Acute concern today is acute on chronic dysuria.  He describes onset of dysuria after Foley catheter insertion in October.  He endorses straining with urination as well as dysuria.  Dysuria has worsened in recent weeks.  UA ordered by gastroenterology to be completed today.  Patient would like to complete this study in our office.  POC UA today is not consistent with infection.  No indication for antibiotic treatment.  Will forward results to GI.      Return in about 6 months (around 08/28/2023).   Manus FORBES Fireman, MD

## 2023-02-28 NOTE — Assessment & Plan Note (Signed)
 Acute concern today is acute on chronic dysuria.  He describes onset of dysuria after Foley catheter insertion in October.  He endorses straining with urination as well as dysuria.  Dysuria has worsened in recent weeks.  UA ordered by gastroenterology to be completed today.  Patient would like to complete this study in our office.  POC UA today is not consistent with infection.  No indication for antibiotic treatment.  Will forward results to GI.

## 2023-03-01 LAB — CBC WITH DIFFERENTIAL/PLATELET
Basophils Absolute: 0 10*3/uL (ref 0.0–0.2)
Basos: 1 %
EOS (ABSOLUTE): 0.1 10*3/uL (ref 0.0–0.4)
Eos: 1 %
Hematocrit: 35.1 % — ABNORMAL LOW (ref 37.5–51.0)
Hemoglobin: 12.6 g/dL — ABNORMAL LOW (ref 13.0–17.7)
Immature Grans (Abs): 0.1 10*3/uL (ref 0.0–0.1)
Immature Granulocytes: 1 %
Lymphocytes Absolute: 2.1 10*3/uL (ref 0.7–3.1)
Lymphs: 26 %
MCH: 33 pg (ref 26.6–33.0)
MCHC: 35.9 g/dL — ABNORMAL HIGH (ref 31.5–35.7)
MCV: 92 fL (ref 79–97)
Monocytes Absolute: 1 10*3/uL — ABNORMAL HIGH (ref 0.1–0.9)
Monocytes: 13 %
Neutrophils Absolute: 4.7 10*3/uL (ref 1.4–7.0)
Neutrophils: 58 %
Platelets: 195 10*3/uL (ref 150–450)
RBC: 3.82 x10E6/uL — ABNORMAL LOW (ref 4.14–5.80)
RDW: 12.8 % (ref 11.6–15.4)
WBC: 8 10*3/uL (ref 3.4–10.8)

## 2023-03-01 LAB — URINALYSIS
Bilirubin, UA: NEGATIVE
Glucose, UA: NEGATIVE
Ketones, UA: NEGATIVE
Leukocytes,UA: NEGATIVE
Nitrite, UA: NEGATIVE
Protein,UA: NEGATIVE
RBC, UA: NEGATIVE
Specific Gravity, UA: 1.025 (ref 1.005–1.030)
Urobilinogen, Ur: 4 mg/dL — ABNORMAL HIGH (ref 0.2–1.0)
pH, UA: 7.5 (ref 5.0–7.5)

## 2023-03-01 LAB — LIPID PANEL
Chol/HDL Ratio: 3.7 {ratio} (ref 0.0–5.0)
Cholesterol, Total: 151 mg/dL (ref 100–199)
HDL: 41 mg/dL (ref >39)
LDL Chol Calc (NIH): 95 mg/dL (ref 0–99)
Triglycerides: 77 mg/dL (ref 0–149)
VLDL Cholesterol Cal: 15 mg/dL (ref 5–40)

## 2023-03-01 LAB — HEMOGLOBIN A1C
Est. average glucose Bld gHb Est-mCnc: 88 mg/dL
Hgb A1c MFr Bld: 4.7 % — ABNORMAL LOW (ref 4.8–5.6)

## 2023-03-01 LAB — VITAMIN D 25 HYDROXY (VIT D DEFICIENCY, FRACTURES): Vit D, 25-Hydroxy: 23.9 ng/mL — ABNORMAL LOW (ref 30.0–100.0)

## 2023-03-01 LAB — TSH+FREE T4
Free T4: 1.29 ng/dL (ref 0.82–1.77)
TSH: 2.6 u[IU]/mL (ref 0.450–4.500)

## 2023-03-01 LAB — B12 AND FOLATE PANEL
Folate: 11.4 ng/mL (ref >3.0)
Vitamin B-12: 1441 pg/mL — ABNORMAL HIGH (ref 232–1245)

## 2023-03-01 NOTE — Telephone Encounter (Signed)
 Forms picked up

## 2023-03-03 ENCOUNTER — Ambulatory Visit (HOSPITAL_COMMUNITY)
Admission: RE | Admit: 2023-03-03 | Discharge: 2023-03-03 | Disposition: A | Discharge status: Home / Self Care | Payer: No Typology Code available for payment source | Source: Ambulatory Visit | Attending: Gastroenterology | Admitting: Gastroenterology

## 2023-03-03 ENCOUNTER — Encounter (HOSPITAL_COMMUNITY): Payer: Self-pay

## 2023-03-03 DIAGNOSIS — R188 Other ascites: Secondary | ICD-10-CM | POA: Insufficient documentation

## 2023-03-03 DIAGNOSIS — C220 Liver cell carcinoma: Secondary | ICD-10-CM | POA: Diagnosis not present

## 2023-03-03 LAB — BODY FLUID CELL COUNT WITH DIFFERENTIAL
Eos, Fluid: 0 %
Lymphs, Fluid: 52 %
Monocyte-Macrophage-Serous Fluid: 39 % — ABNORMAL LOW (ref 50–90)
Neutrophil Count, Fluid: 9 % (ref 0–25)
Total Nucleated Cell Count, Fluid: 369 uL (ref 0–1000)

## 2023-03-03 LAB — URINE CULTURE

## 2023-03-03 MED ORDER — ALBUMIN HUMAN 25 % IV SOLN
INTRAVENOUS | Status: AC
  Filled 2023-03-03: qty 200

## 2023-03-03 MED ORDER — LIDOCAINE HCL (PF) 2 % IJ SOLN
10.0000 mL | Freq: Once | INTRAMUSCULAR | Status: AC
  Administered 2023-03-03: 10 mL

## 2023-03-03 MED ORDER — LIDOCAINE HCL (PF) 2 % IJ SOLN
INTRAMUSCULAR | Status: AC
  Filled 2023-03-03: qty 10

## 2023-03-03 MED ORDER — ALBUMIN HUMAN 25 % IV SOLN
50.0000 g | Freq: Once | INTRAVENOUS | Status: AC
  Administered 2023-03-03: 50 g via INTRAVENOUS

## 2023-03-03 NOTE — Progress Notes (Signed)
Patient tolerated right sided paracentesis and 50G of IV albumin well today and 4 Liters of clear yellow ascites removed with labs collected and sent for processing. Patient verbalized understanding of discharge instructions and ambulatory with cane at departure with no acute distress noted.

## 2023-03-06 ENCOUNTER — Ambulatory Visit (HOSPITAL_COMMUNITY): Admission: RE | Admit: 2023-03-06 | Payer: No Typology Code available for payment source | Source: Ambulatory Visit

## 2023-03-07 ENCOUNTER — Other Ambulatory Visit: Payer: Self-pay | Admitting: *Deleted

## 2023-03-07 ENCOUNTER — Encounter: Payer: Self-pay | Admitting: *Deleted

## 2023-03-07 ENCOUNTER — Ambulatory Visit (INDEPENDENT_AMBULATORY_CARE_PROVIDER_SITE_OTHER): Payer: No Typology Code available for payment source | Admitting: Internal Medicine

## 2023-03-07 VITALS — BP 115/73 | HR 74 | Temp 98.1°F | Ht 68.5 in | Wt 178.6 lb

## 2023-03-07 DIAGNOSIS — R600 Localized edema: Secondary | ICD-10-CM

## 2023-03-07 DIAGNOSIS — R188 Other ascites: Secondary | ICD-10-CM

## 2023-03-07 DIAGNOSIS — K7031 Alcoholic cirrhosis of liver with ascites: Secondary | ICD-10-CM

## 2023-03-07 DIAGNOSIS — F1091 Alcohol use, unspecified, in remission: Secondary | ICD-10-CM | POA: Diagnosis not present

## 2023-03-07 NOTE — H&P (View-Only) (Signed)
 Primary Care Physician:  Billie Lade, MD Primary Gastroenterologist:  Dr. Jena Gauss  Pre-Procedure History & Physical: HPI:  Billy Chung is a 63 y.o. male here for  follow-up of recently decompensated EtOH related cirrhosis manifested as anasarca/ascites.  He has required 6 large-volume paracentesis since April.  He is on on both Aldactone and Lasix.  He states he is watching the sodium in his diet. He needs to have a screening EGD  He stopped drinking alcohol cold [place] in October went through DTs has not had any alcohol since that time.  Large quantities of beer over the years (5 DWIs).  He is accompanied by his ex-wife who is a significant caregiver for him.  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 Immunoglobulins elevated as was ANA and anti-smooth muscle antibody AMA negative.  Iron studies elevated felt to be acute phase reactant related to alcohol. All taps have been negative for SBP.  Cross-sectional imaging via CT last fall demonstrated no evidence of hepatocellular carcinoma.  Chronic ankle pain with secondary debilitation.   Past Medical History:  Diagnosis Date   Alcohol abuse    Cirrhosis (HCC)    Pneumonia     Past Surgical History:  Procedure Laterality Date   APPENDECTOMY     ORIF ANKLE FRACTURE Right 11/04/2015   Procedure: OPEN REDUCTION INTERNAL FIXATION (ORIF) ANKLE FRACTURE;  Surgeon: Nadara Mustard, MD;  Location: [facility] OR;  Service: Orthopedics;  Laterality: Right;    Prior to Admission medications   Medication Sig Start Date End Date Taking? Authorizing Provider  Cholecalciferol (VITAMIN D3) 50 MCG (2000 UT) capsule Take 2,000 Units by mouth daily.   Yes [provider]  furosemide (LASIX) 40 MG tablet Take 40mg  in am and 20mg  in pm 02/20/23  Yes Tiffany Kocher, PA-C  polyethylene glycol powder (GLYCOLAX/MIRALAX) 17 GM/SCOOP powder Take 1 Container by mouth daily as needed.   Yes [provider]  spironolactone  (ALDACTONE) 100 MG tablet Take 100mg  in am and 50mg  in pm 02/20/23  Yes Tiffany Kocher, PA-C    Allergies as of 03/07/2023 - Review Complete 03/07/2023  Allergen Reaction Noted   No known allergies  11/04/2015    Family History  Problem Relation Age of Onset   Cirrhosis Maternal Grandmother        no etoh   Cirrhosis Maternal Aunt        no etoh   Colon cancer Neg Hx     Social History   Socioeconomic History   Marital status: Single    Spouse name: Not on file   Number of children: Not on file   Years of education: Not on file   Highest education level: Not on file  Occupational History   Not on file  Tobacco Use   Smoking status: Former   Smokeless tobacco: Former    Types: Engineer, drilling   Vaping status: Never Used  Substance and Sexual Activity   Alcohol use: Not Currently    Alcohol/week: 168.0 standard drinks of alcohol    Types: 168 Cans of beer per week    Comment: daily (stopped 11/24/22)   Drug use: Not Currently    Types: Cocaine    Comment: former drug use (2 years ago)   Sexual activity: Yes  Other Topics Concern   Not on file  Social History Narrative   Not on file   Social Drivers of Corporate investment banker  Strain: Not on file  Food Insecurity: Not on file  Transportation Needs: Not on file  Physical Activity: Not on file  Stress: Not on file  Social Connections: Not on file  Intimate Partner Violence: Not on file    Review of Systems: See HPI, otherwise negative ROS  Physical Exam: BP 115/73 (BP Location: Left Arm, Patient Position: Sitting, Cuff Size: Large)   Pulse 74   Temp 98.1 F (36.7 C) (Oral)   Ht 5' 8.5" (1.74 m)   Wt 178 lb 9.6 oz (81 kg)   SpO2 99%   BMI 26.76 kg/m  General:   Alert, conversant well oriented.  Somewhat gaunt appearing.  Accompanied by his ex-wife.   Eyes:  Sclera clear, no icterus.   Conjunctiva pink. Lungs:  Clear throughout to auscultation.   No wheezes, crackles, or rhonchi. No acute  distress. Heart:  Regular rate and rhythm; no murmurs, clicks, rubs,  or gallops. Abdomen: Full mildly distended.  No fluid wave or shifting dullness.  Obvious organomegaly.   Extremities:  Without clubbing or edema.  Impression/Plan: Decompensated EtOH related liver disease manifested as ascites/anasarca.  He has required multiple 7 LVP's since October.  No evidence of SBP.  He likely needs to do better on sodium intake.  Currently on Aldactone and furosemide as outlined above. Positive ANA and antismooth ab of uncertain significance at this time  Commended on alcohol cessation now  -has been almost 4 months now.  Discussed the possibility of a TIPS if we cannot get third spaced fluid under control.  Briefly reviewed this procedure.  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  Long-term possibility of liver transplant has also been briefly discussed.  Recommendations:  As discussed, updated labs needed c-Met, INR, CBC.  Repeat autoimmune markers, iron studies in about 3 months depending on c-Met findings.  Information on 2 g sodium diet provided  Recommend dietary consult regarding 2 g sodium diet.  Caregivers to accompany him.  Obtain hepatitis B vaccine  Will schedule an EGD to screen for esophageal varices in the near future ASA 3.  The risks, benefits, limitations, alternatives and imponderables have been reviewed with the patient. Potential for esophageal dilation, biopsy, etc. have also been reviewed.  Questions have been answered. All parties agreeable.   If diuretics,sodium restriction do not decrease ascites/ need for abdominal taps, may need to go for IR consultation (TIPS placement)  Interval office follow-up to be scheduled      Notice: This dictation was prepared with Dragon dictation along with smaller phrase technology. Any transcriptional errors that result from this process are unintentional and may not be corrected upon  review.

## 2023-03-07 NOTE — Progress Notes (Signed)
Primary Care Physician:  Billie Lade, MD Primary Gastroenterologist:  Dr. Jena Gauss  Pre-Procedure History & Physical: HPI:  Billy Chung is a 63 y.o. male here for  follow-up of recently decompensated EtOH related cirrhosis manifested as anasarca/ascites.  He has required 6 large-volume paracentesis since April.  He is on on both Aldactone and Lasix.  He states he is watching the sodium in his diet. He needs to have a screening EGD  He stopped drinking alcohol cold [place] in October went through DTs has not had any alcohol since that time.  Large quantities of beer over the years (5 DWIs).  He is accompanied by his ex-wife who is a significant caregiver for him.  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 Immunoglobulins elevated as was ANA and anti-smooth muscle antibody AMA negative.  Iron studies elevated felt to be acute phase reactant related to alcohol. All taps have been negative for SBP.  Cross-sectional imaging via CT last fall demonstrated no evidence of hepatocellular carcinoma.  Chronic ankle pain with secondary debilitation.   Past Medical History:  Diagnosis Date   Alcohol abuse    Cirrhosis (HCC)    Pneumonia     Past Surgical History:  Procedure Laterality Date   APPENDECTOMY     ORIF ANKLE FRACTURE Right 11/04/2015   Procedure: OPEN REDUCTION INTERNAL FIXATION (ORIF) ANKLE FRACTURE;  Surgeon: Nadara Mustard, MD;  Location: [facility] OR;  Service: Orthopedics;  Laterality: Right;    Prior to Admission medications   Medication Sig Start Date End Date Taking? Authorizing Provider  Cholecalciferol (VITAMIN D3) 50 MCG (2000 UT) capsule Take 2,000 Units by mouth daily.   Yes [provider]  furosemide (LASIX) 40 MG tablet Take 40mg  in am and 20mg  in pm 02/20/23  Yes Tiffany Kocher, PA-C  polyethylene glycol powder (GLYCOLAX/MIRALAX) 17 GM/SCOOP powder Take 1 Container by mouth daily as needed.   Yes [provider]  spironolactone  (ALDACTONE) 100 MG tablet Take 100mg  in am and 50mg  in pm 02/20/23  Yes Tiffany Kocher, PA-C    Allergies as of 03/07/2023 - Review Complete 03/07/2023  Allergen Reaction Noted   No known allergies  11/04/2015    Family History  Problem Relation Age of Onset   Cirrhosis Maternal Grandmother        no etoh   Cirrhosis Maternal Aunt        no etoh   Colon cancer Neg Hx     Social History   Socioeconomic History   Marital status: Single    Spouse name: Not on file   Number of children: Not on file   Years of education: Not on file   Highest education level: Not on file  Occupational History   Not on file  Tobacco Use   Smoking status: Former   Smokeless tobacco: Former    Types: Engineer, drilling   Vaping status: Never Used  Substance and Sexual Activity   Alcohol use: Not Currently    Alcohol/week: 168.0 standard drinks of alcohol    Types: 168 Cans of beer per week    Comment: daily (stopped 11/24/22)   Drug use: Not Currently    Types: Cocaine    Comment: former drug use (2 years ago)   Sexual activity: Yes  Other Topics Concern   Not on file  Social History Narrative   Not on file   Social Drivers of Corporate investment banker  Strain: Not on file  Food Insecurity: Not on file  Transportation Needs: Not on file  Physical Activity: Not on file  Stress: Not on file  Social Connections: Not on file  Intimate Partner Violence: Not on file    Review of Systems: See HPI, otherwise negative ROS  Physical Exam: BP 115/73 (BP Location: Left Arm, Patient Position: Sitting, Cuff Size: Large)   Pulse 74   Temp 98.1 F (36.7 C) (Oral)   Ht 5' 8.5" (1.74 m)   Wt 178 lb 9.6 oz (81 kg)   SpO2 99%   BMI 26.76 kg/m  General:   Alert, conversant well oriented.  Somewhat gaunt appearing.  Accompanied by his ex-wife.   Eyes:  Sclera clear, no icterus.   Conjunctiva pink. Lungs:  Clear throughout to auscultation.   No wheezes, crackles, or rhonchi. No acute  distress. Heart:  Regular rate and rhythm; no murmurs, clicks, rubs,  or gallops. Abdomen: Full mildly distended.  No fluid wave or shifting dullness.  Obvious organomegaly.   Extremities:  Without clubbing or edema.  Impression/Plan: Decompensated EtOH related liver disease manifested as ascites/anasarca.  He has required multiple 7 LVP's since October.  No evidence of SBP.  He likely needs to do better on sodium intake.  Currently on Aldactone and furosemide as outlined above. Positive ANA and antismooth ab of uncertain significance at this time  Commended on alcohol cessation now  -has been almost 4 months now.  Discussed the possibility of a TIPS if we cannot get third spaced fluid under control.  Briefly reviewed this procedure.  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  Long-term possibility of liver transplant has also been briefly discussed.  Recommendations:  As discussed, updated labs needed c-Met, INR, CBC.  Repeat autoimmune markers, iron studies in about 3 months depending on c-Met findings.  Information on 2 g sodium diet provided  Recommend dietary consult regarding 2 g sodium diet.  Caregivers to accompany him.  Obtain hepatitis B vaccine  Will schedule an EGD to screen for esophageal varices in the near future ASA 3.  The risks, benefits, limitations, alternatives and imponderables have been reviewed with the patient. Potential for esophageal dilation, biopsy, etc. have also been reviewed.  Questions have been answered. All parties agreeable.   If diuretics,sodium restriction do not decrease ascites/ need for abdominal taps, may need to go for IR consultation (TIPS placement)  Interval office follow-up to be scheduled      Notice: This dictation was prepared with Dragon dictation along with smaller phrase technology. Any transcriptional errors that result from this process are unintentional and may not be corrected upon  review.

## 2023-03-07 NOTE — Patient Instructions (Signed)
It was nice to meet you today!  As discussed, updated labs needed c-Met, INR, CBC  As discussed, sodium is everywhere.  You may still be getting too much sodium in your diet.  Information on 2 g sodium diet provided  Would be worth your time and trouble to see the dietitian for sit down consultation on you can cut down further on sodium  You need to go ahead and get hepatitis B vaccine  Will schedule an EGD to screen for esophageal varices in the near future ASA 3  If fluid pills and sodium restriction do not decrease abdominal swelling and need for abdominal taps, may need to go get a shunt in your liver (TIPS)  Further recommendations to follow.

## 2023-03-10 LAB — CBC WITH DIFFERENTIAL/PLATELET
Basophils Absolute: 0 x10E3/uL (ref 0.0–0.2)
Basos: 0 %
EOS (ABSOLUTE): 0.1 x10E3/uL (ref 0.0–0.4)
Eos: 1 %
Hematocrit: 33.7 % — ABNORMAL LOW (ref 37.5–51.0)
Hemoglobin: 11.5 g/dL — ABNORMAL LOW (ref 13.0–17.7)
Immature Grans (Abs): 0 x10E3/uL (ref 0.0–0.1)
Immature Granulocytes: 1 %
Lymphocytes Absolute: 1.5 x10E3/uL (ref 0.7–3.1)
Lymphs: 23 %
MCH: 32.6 pg (ref 26.6–33.0)
MCHC: 34.1 g/dL (ref 31.5–35.7)
MCV: 96 fL (ref 79–97)
Monocytes Absolute: 0.7 x10E3/uL (ref 0.1–0.9)
Monocytes: 10 %
Neutrophils Absolute: 4.2 x10E3/uL (ref 1.4–7.0)
Neutrophils: 65 %
Platelets: 162 x10E3/uL (ref 150–450)
RBC: 3.53 x10E6/uL — ABNORMAL LOW (ref 4.14–5.80)
RDW: 14.3 % (ref 11.6–15.4)
WBC: 6.5 x10E3/uL (ref 3.4–10.8)

## 2023-03-10 LAB — COMPREHENSIVE METABOLIC PANEL WITH GFR
ALT: 26 IU/L (ref 0–44)
AST: 49 IU/L — ABNORMAL HIGH (ref 0–40)
Albumin: 3.3 g/dL — ABNORMAL LOW (ref 3.9–4.9)
Alkaline Phosphatase: 106 IU/L (ref 44–121)
BUN/Creatinine Ratio: 10 (ref 10–24)
BUN: 9 mg/dL (ref 8–27)
Bilirubin Total: 2.5 mg/dL — ABNORMAL HIGH (ref 0.0–1.2)
CO2: 21 mmol/L (ref 20–29)
Calcium: 8.6 mg/dL (ref 8.6–10.2)
Chloride: 102 mmol/L (ref 96–106)
Creatinine, Ser: 0.93 mg/dL (ref 0.76–1.27)
Globulin, Total: 3.2 g/dL (ref 1.5–4.5)
Glucose: 96 mg/dL (ref 70–99)
Potassium: 3.9 mmol/L (ref 3.5–5.2)
Sodium: 136 mmol/L (ref 134–144)
Total Protein: 6.5 g/dL (ref 6.0–8.5)
eGFR: 92 mL/min/1.73

## 2023-03-10 LAB — PROTIME-INR
INR: 1.3 — ABNORMAL HIGH (ref 0.9–1.2)
Prothrombin Time: 14 s — ABNORMAL HIGH (ref 9.1–12.0)

## 2023-03-13 ENCOUNTER — Ambulatory Visit (HOSPITAL_COMMUNITY)
Admission: RE | Admit: 2023-03-13 | Discharge: 2023-03-13 | Disposition: A | Discharge status: Home / Self Care | Payer: No Typology Code available for payment source | Source: Ambulatory Visit | Attending: Gastroenterology | Admitting: Gastroenterology

## 2023-03-13 ENCOUNTER — Encounter (HOSPITAL_COMMUNITY): Payer: Self-pay

## 2023-03-13 ENCOUNTER — Other Ambulatory Visit: Payer: Self-pay | Admitting: Gastroenterology

## 2023-03-13 DIAGNOSIS — R188 Other ascites: Secondary | ICD-10-CM | POA: Diagnosis present

## 2023-03-13 MED ORDER — ALBUMIN HUMAN 25 % IV SOLN
INTRAVENOUS | Status: AC
  Filled 2023-03-13: qty 200

## 2023-03-13 MED ORDER — LIDOCAINE HCL (PF) 2 % IJ SOLN
INTRAMUSCULAR | Status: AC
  Filled 2023-03-13: qty 10

## 2023-03-13 MED ORDER — ALBUMIN HUMAN 25 % IV SOLN
0.0000 g | Freq: Once | INTRAVENOUS | Status: DC
  Filled 2023-03-13: qty 400

## 2023-03-15 ENCOUNTER — Telehealth: Payer: Self-pay | Admitting: *Deleted

## 2023-03-15 NOTE — Telephone Encounter (Signed)
Brandi from pre-service center left vm saying that the [place] para needed to have prior authorization.  Submitted pa via navinet and also spoke to [place] N with [employer] and she says no prior Berkley Harvey is required. Auth # [id number]  LMOVM for Merry Proud to return call

## 2023-03-15 NOTE — Telephone Encounter (Signed)
Brandi from pre-service center informed of conversation with Huel Cote from [place].

## 2023-03-16 ENCOUNTER — Other Ambulatory Visit (INDEPENDENT_AMBULATORY_CARE_PROVIDER_SITE_OTHER): Payer: Self-pay

## 2023-03-16 ENCOUNTER — Ambulatory Visit (INDEPENDENT_AMBULATORY_CARE_PROVIDER_SITE_OTHER): Payer: No Typology Code available for payment source | Admitting: Orthopaedic Surgery

## 2023-03-16 ENCOUNTER — Encounter: Payer: Self-pay | Admitting: Orthopaedic Surgery

## 2023-03-16 VITALS — BP 130/82 | HR 65 | Ht 69.0 in | Wt 205.2 lb

## 2023-03-16 DIAGNOSIS — M25572 Pain in left ankle and joints of left foot: Secondary | ICD-10-CM

## 2023-03-16 MED ORDER — HYDROCODONE-ACETAMINOPHEN 5-325 MG PO TABS: ORAL_TABLET | ORAL | 0 refills | Status: DC

## 2023-03-16 MED ORDER — PREDNISONE 5 MG (21) PO TBPK: ORAL_TABLET | ORAL | 0 refills | Status: DC

## 2023-03-16 NOTE — Progress Notes (Addendum)
Subjective:    Patient ID: Billy Chung, male    DOB: 10-29-1960, 63 y.o.   MRN: [id number]  HPI He has developed pain in the left ankle over the last couple of months.  He broke his right ankle then and put more weight on the left ankle.  It is tender, swells, hurts with weight bearing.  He has more pain medially.  He has no trauma.  He has no redness.  ROM is painful at times and he can hardly walk at times.  He has had problems with alcohol abuse and has cirrhosis of the liver.  He is not drinking now.  Tylenol does not help.  Rubs do not help.   Review of Systems  Constitutional:  Positive for activity change.  Musculoskeletal:  Positive for arthralgias, joint swelling and myalgias.  All other systems reviewed and are negative. For Review of Systems, all other systems reviewed and are negative.  The following is a summary of the past history medically, past history surgically, known current medicines, social history and family history.  This information is gathered electronically by the computer from prior information and documentation.  I review this each visit and have found including this information at this point in the chart is beneficial and informative.   Past Medical History:  Diagnosis Date   Alcohol abuse    Cirrhosis (HCC)    Pneumonia     Past Surgical History:  Procedure Laterality Date   APPENDECTOMY     ORIF ANKLE FRACTURE Right 11/04/2015   Procedure: OPEN REDUCTION INTERNAL FIXATION (ORIF) ANKLE FRACTURE;  Surgeon: Nadara Mustard, MD;  Location: [facility] OR;  Service: Orthopedics;  Laterality: Right;    Current Outpatient Medications on File Prior to Visit  Medication Sig Dispense Refill   Cholecalciferol (VITAMIN D3) 50 MCG (2000 UT) capsule Take 2,000 Units by mouth daily.     furosemide (LASIX) 40 MG tablet Take 40mg  in am and 20mg  in pm 45 tablet 5   polyethylene glycol powder (GLYCOLAX/MIRALAX) 17 GM/SCOOP powder Take 1 Container by mouth daily as needed.      spironolactone (ALDACTONE) 100 MG tablet Take 100mg  in am and 50mg  in pm 45 tablet 5   No current facility-administered medications on file prior to visit.    Social History   Socioeconomic History   Marital status: Single    Spouse name: Not on file   Number of children: Not on file   Years of education: Not on file   Highest education level: Not on file  Occupational History   Not on file  Tobacco Use   Smoking status: Former   Smokeless tobacco: Former    Types: Designer, multimedia Use   Vaping status: Never Used  Substance and Sexual Activity   Alcohol use: Not Currently    Alcohol/week: 168.0 standard drinks of alcohol    Types: 168 Cans of beer per week    Comment: daily (stopped 11/24/22)   Drug use: Not Currently    Types: Cocaine    Comment: former drug use (2 years ago)   Sexual activity: Yes  Other Topics Concern   Not on file  Social History Narrative   Not on file   Social Drivers of Health   Financial Resource Strain: Not on file  Food Insecurity: Not on file  Transportation Needs: Not on file  Physical Activity: Not on file  Stress: Not on file  Social Connections: Not on file  Intimate Partner Violence:  Not on file    Family History  Problem Relation Age of Onset   Cirrhosis Maternal Grandmother        no etoh   Cirrhosis Maternal Aunt        no etoh   Colon cancer Neg Hx     BP 130/82   Pulse 65   Ht 5\' 9"  (1.753 m)   Wt 205 lb 4 oz (93.1 kg)   BMI 30.31 kg/m   Body mass index is 30.31 kg/m.      Objective:   Physical Exam Vitals and nursing note reviewed. Exam conducted with a chaperone present.  Constitutional:      Appearance: He is well-developed.  HENT:     Head: Normocephalic and atraumatic.  Eyes:     Conjunctiva/sclera: Conjunctivae normal.     Pupils: Pupils are equal, round, and reactive to light.  Cardiovascular:     Rate and Rhythm: Normal rate and regular rhythm.  Pulmonary:     Effort: Pulmonary effort is  normal.  Abdominal:     Comments: Ascites present abdomen.  Musculoskeletal:     Cervical back: Normal range of motion and neck supple.  Skin:    General: Skin is warm and dry.  Neurological:     Mental Status: He is alert and oriented to person, place, and time.     Cranial Nerves: No cranial nerve deficit.     Motor: No abnormal muscle tone.     Coordination: Coordination normal.     Deep Tendon Reflexes: Reflexes are normal and symmetric. Reflexes normal.  Psychiatric:        Behavior: Behavior normal.        Thought Content: Thought content normal.        Judgment: Judgment normal.   X-rays were done of the left ankle, reported separately.        Assessment & Plan:   Encounter Diagnosis  Name Primary?   Pain in left ankle and joints of left foot Yes   I will begin prednisone dose pack for the ankle.  He is very tender medially.  I am concerned about possible gout.  I will get serum uric acid level.  Return in one week.  I have reviewed the [place] Controlled Substance Reporting System web site prior to prescribing narcotic medicine for this patient.  Call if any problem.  Precautions discussed.  Electronically Signed Darreld Mclean, MD 1/30/202510:07 AM

## 2023-03-17 ENCOUNTER — Ambulatory Visit (HOSPITAL_COMMUNITY): Admission: RE | Admit: 2023-03-17 | Payer: No Typology Code available for payment source | Source: Ambulatory Visit

## 2023-03-17 LAB — PATHOLOGIST SMEAR REVIEW

## 2023-03-18 LAB — URIC ACID: Uric Acid, Serum: 6.9 mg/dL (ref 4.0–8.0)

## 2023-03-20 ENCOUNTER — Other Ambulatory Visit: Payer: Self-pay | Admitting: *Deleted

## 2023-03-20 DIAGNOSIS — K7460 Unspecified cirrhosis of liver: Secondary | ICD-10-CM

## 2023-03-20 NOTE — Telephone Encounter (Signed)
[employer] PA for [place] Para: 03/22/2023 - 04/14/2023  [id number] Unit(s) [phone] PENDING

## 2023-03-21 ENCOUNTER — Other Ambulatory Visit (HOSPITAL_COMMUNITY): Payer: No Typology Code available for payment source

## 2023-03-21 NOTE — Telephone Encounter (Signed)
Received call from [facility] with [employer]. Since patient already has approved auth on file, we need to send fax to [phone] asking for auth to be extended. Fax has been sent

## 2023-03-22 ENCOUNTER — Ambulatory Visit (HOSPITAL_COMMUNITY): Payer: No Typology Code available for payment source

## 2023-03-22 ENCOUNTER — Encounter: Payer: Self-pay | Admitting: Orthopaedic Surgery

## 2023-03-22 ENCOUNTER — Ambulatory Visit: Payer: No Typology Code available for payment source | Admitting: Orthopaedic Surgery

## 2023-03-22 VITALS — Ht 66.0 in | Wt 206.0 lb

## 2023-03-22 DIAGNOSIS — M25572 Pain in left ankle and joints of left foot: Secondary | ICD-10-CM | POA: Diagnosis not present

## 2023-03-22 MED ORDER — HYDROCODONE-ACETAMINOPHEN 5-325 MG PO TABS: ORAL_TABLET | ORAL | 0 refills | Status: DC

## 2023-03-22 MED ORDER — PREDNISONE 5 MG (21) PO TBPK: ORAL_TABLET | ORAL | 0 refills | Status: DC

## 2023-03-22 NOTE — Telephone Encounter (Signed)
 PA approved and fax scanned into media  Approved for 4 visits

## 2023-03-22 NOTE — Progress Notes (Signed)
 I am better but I overdid it  He has had good results from the prednisone  but he overdid it yesterday.  He was feeling better and the weather was in the 70's (after being in the 20's) and he just did more than he should have.  His left ankle is better but still hurts.  It is not swollen today and not as tender.  His uric acid was 6.9.  NV intact.  ROM of the left ankle is good.  He has a limp but not as much as last time.  He has a cane but is hardly using it today.  There is no erythema.   Encounter Diagnosis  Name Primary?   Pain in left ankle and joints of left foot Yes   I will renew his pain medicine and prednisone  for one more week.  Try not to overdo it this week.  Call if any problem.  Precautions discussed.  Electronically Signed Lemond Stable, MD 2/5/20258:10 AM

## 2023-03-23 ENCOUNTER — Ambulatory Visit: Payer: No Typology Code available for payment source | Admitting: Orthopaedic Surgery

## 2023-03-29 ENCOUNTER — Ambulatory Visit
Admission: RE | Admit: 2023-03-29 | Discharge: 2023-03-29 | Disposition: A | Discharge status: Home / Self Care | Payer: No Typology Code available for payment source | Source: Ambulatory Visit | Attending: Internal Medicine | Admitting: Internal Medicine

## 2023-03-29 DIAGNOSIS — K7460 Unspecified cirrhosis of liver: Secondary | ICD-10-CM

## 2023-03-29 NOTE — Consult Note (Signed)
Chief Complaint: Patient was seen in consultation today for cirrhosis requiring frequent paracentesis at the request of Rourk,Robert M  Referring Physician(s): Rourk,Robert M  History of Present Illness: Billy Chung is a 63 y.o. male h/o EtOH abuse s/p sobrienty since October 2024, some DTs at the time. Has required recurrent large volume paracentesis x8 removing up to 8L per session since then. Notably on his most recent presentation on 03/13/23 there was only scant ascites so paracentesis was deferred. No e/o SBP. He feels like is medical management is working well. No h/o GI bleed. Echo showed nl RV function 05/20/2021.  Past Medical History:  Diagnosis Date   Alcohol abuse    Cirrhosis (HCC)    Pneumonia     Past Surgical History:  Procedure Laterality Date   APPENDECTOMY     ORIF ANKLE FRACTURE Right 11/04/2015   Procedure: OPEN REDUCTION INTERNAL FIXATION (ORIF) ANKLE FRACTURE;  Surgeon: Nadara Mustard, MD;  Location: [facility] OR;  Service: Orthopedics;  Laterality: Right;    Allergies: No known allergies  Medications: Prior to Admission medications   Medication Sig Start Date End Date Taking? Authorizing Provider  Cholecalciferol (VITAMIN D3) 50 MCG (2000 UT) capsule Take 2,000 Units by mouth daily.    [provider]  furosemide (LASIX) 40 MG tablet Take 40mg  in am and 20mg  in pm 02/20/23   Tiffany Kocher, PA-C  HYDROcodone-acetaminophen (NORCO/VICODIN) 5-325 MG tablet One tablet every four hours for pain. 03/22/23   Darreld Mclean, MD  polyethylene glycol powder (GLYCOLAX/MIRALAX) 17 GM/SCOOP powder Take 1 Container by mouth daily as needed.    [provider]  predniSONE (STERAPRED UNI-PAK 21 TAB) 5 MG (21) TBPK tablet Take 6 pills first day; 5 pills second day; 4 pills third day; 3 pills fourth day; 2 pills next day and 1 pill last day. 03/22/23   Darreld Mclean, MD  spironolactone (ALDACTONE) 100 MG tablet Take 100mg  in am and 50mg  in pm 02/20/23   Tiffany Kocher, PA-C     Family History  Problem Relation Age of Onset   Cirrhosis Maternal Grandmother        no etoh   Cirrhosis Maternal Aunt        no etoh   Colon cancer Neg Hx     Social History   Socioeconomic History   Marital status: Single    Spouse name: Not on file   Number of children: Not on file   Years of education: Not on file   Highest education level: Not on file  Occupational History   Not on file  Tobacco Use   Smoking status: Former   Smokeless tobacco: Former    Types: Designer, multimedia Use   Vaping status: Never Used  Substance and Sexual Activity   Alcohol use: Not Currently    Alcohol/week: 168.0 standard drinks of alcohol    Types: 168 Cans of beer per week    Comment: daily (stopped 11/24/22)   Drug use: Not Currently    Types: Cocaine    Comment: former drug use (2 years ago)   Sexual activity: Yes  Other Topics Concern   Not on file  Social History Narrative   Not on file   Social Drivers of Health   Financial Resource Strain: Not on file  Food Insecurity: Not on file  Transportation Needs: Not on file  Physical Activity: Not on file  Stress: Not on file  Social Connections: Not on file  ECOG Status: 1 - Symptomatic but completely ambulatory  Review of Systems: A 12 point ROS discussed and pertinent positives are indicated in the HPI above.  All other systems are negative.  Review of Systems  Vital Signs: BP 133/83   Pulse 64   Temp 97.6 F (36.4 C) (Oral)   Resp 19   SpO2 97%     Physical Exam  Mallampati Score:     Imaging: DG Ankle Complete Left Result Date: 03/16/2023 Clinical:  left ankle pain, no trauma X-rays were done of the left ankle, three views. The ankle mortise is normal.  There is diffuse degenerative changes around the right ankle with osteophytes medial and lateral with small calcific deposits anteriorly on the lateral.  Talus has no fracture, no fracture noted.  Bone quality is good. Impression:   diffuse degenerative changes of the left ankle, no acute injury. Electronically Signed Darreld Mclean, MD 1/30/20258:37 AM  [place] ASCITES (ABDOMEN LIMITED) Result Date: 03/13/2023 CLINICAL DATA:  Ascites.  Evaluate for paracentesis. EXAM: LIMITED ABDOMEN ULTRASOUND FOR ASCITES TECHNIQUE: Limited ultrasound survey for ascites was performed in all four abdominal quadrants. COMPARISON:  03/03/2023 FINDINGS: Small to moderate amount of ascites in left lower quadrant with limited percutaneous window. No significant ascites in the right lower quadrant. IMPRESSION: Small to moderate amount of fluid in the left lower quadrant. Paracentesis not performed. Electronically Signed   By: Richarda Overlie M.D.   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 PROCEDURE: Informed written consent was obtained from the patient after a discussion of the risks, benefits and alternatives to treatment. A timeout was performed prior to the initiation of the procedure. Initial ultrasound scanning demonstrates a moderately large amount of ascites within the right lower abdominal quadrant. The right lower abdomen was prepped and draped in the usual sterile fashion. 1% lidocaine was used for local anesthesia. Following this, a 6 Fr Safe-T-Centesis catheter was introduced. An ultrasound image was saved for documentation purposes. The paracentesis was performed. The catheter was removed and a dressing was applied. The patient tolerated the procedure well without immediate post procedural complication. Patient received post-procedure intravenous albumin; see nursing notes for details. FINDINGS: A total of approximately 4 L of clear yellow fluid was removed. Samples were sent to the laboratory as requested by the clinical team.  IMPRESSION: Successful ultrasound-guided paracentesis yielding 4 liters of peritoneal fluid. Electronically Signed   By: Corlis Leak M.D.   On: 03/03/2023 11:29    Labs:  CBC: Recent Labs    12/07/22 1104 01/04/23 1401 02/28/23 0912 03/09/23 1137  WBC 5.9 8.3 8.0 6.5  HGB 12.8* 12.4* 12.6* 11.5*  HCT 35.9* 35.6* 35.1* 33.7*  PLT 196 212 195 162    COAGS: Recent Labs    12/07/22 1104 01/04/23 1401 03/09/23 1137  INR 1.5* 1.3* 1.3*    BMP: Recent Labs    12/03/22 1316 12/07/22 1104 01/04/23 1401 02/24/23 1023 03/09/23 1137  NA 129* 133* 134 133* 136  K 3.4* 3.8 4.1 3.7 3.9  CL 99 99 100 103 102  CO2 23 20 22 23 21   GLUCOSE 87 81 98 95 96  BUN 9 9 10 10 9   CALCIUM 7.9* 8.1* 8.2* 8.6* 8.6  CREATININE 0.69 0.78 0.97 0.80 0.93  GFRNONAA >60  --   --  >60  --  LIVER FUNCTION TESTS: Recent Labs    12/03/22 1316 12/07/22 1104 01/04/23 1401 03/09/23 1137  BILITOT 6.3* 5.5* 3.1* 2.5*  AST 57* 54* 51* 49*  ALT 28 25 26 26   ALKPHOS 106 134* 121 106  PROT 6.6 7.0 6.5 6.5  ALBUMIN 2.2* 2.7* 2.4* 3.3*    TUMOR MARKERS: No results for input(s): "AFPTM", "CEA", "CA199", "CHROMGRNA" in the last 8760 hours.  Assessment and Plan: My impression is that this patient has had recurrent large volume abdominal ascites, almost certainly secondary to his advanced end-stage liver disease and cirrhosis, requiring frequent large-volume paracentesis for symptomatic control.Currently he seems to be under control with medical management.  His RV function  was normal but would consider repeat Echo if TIPS seems imminent, as well as US liver doppler.  His MELD score of 15 implies an increased risk of complications or poor outcomes post TIPS.    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  We discussed the TIPS creation technique, bypassing the hepatic parenchyma to decrease portal  venous hypertension and subsequent ascites creation.  We discussed  anticipated benefits, possible risks and complications including but not limited to bleeding, organ or nerve damage, infection, death.  We discussed the progressive nature of cirrhosis which can only be cured with hepatic transplant, and may worsen with or without TIPS creation.  We acknowledged that TIPS creation is not a life prolonging procedure, only palliative.  We discussed the frequent postoperative requirement for outpatient p.o. lactulose to manage ammonia levels and avoid hepatic encephalopathy which if untreated can lead to lethargy, somnolence, mental slowness, and ultimately potentially coma and death.  We discussed the need for lifelong ultrasound surveillance to  confirm TIPS patency.  He seemed to understand and did ask appropriate questions,  Which were answered.  Currently he is not a candidate for TIPS given his controlled ascites and elevated MELD. Should his clinical situation change, we can reassess. They both seemed to understand, and asked appropriate questions which were answered. I gave the my card, to call if any questions.  Thank you for this interesting consult.  I greatly enjoyed meeting Billy Chung and look forward to participating in their care as needed.  A copy of this report was sent to the requesting provider on this date.  Electronically Signed: Durwin Glaze 03/29/2023, 1:43 PM   I spent a total of  40 Minutes   in face to face in clinical consultation, greater than 50% of which was counseling/coordinating care for cirrhosis and ascites.

## 2023-03-30 ENCOUNTER — Encounter: Payer: Self-pay | Admitting: Orthopaedic Surgery

## 2023-03-30 ENCOUNTER — Ambulatory Visit: Payer: No Typology Code available for payment source | Admitting: Orthopaedic Surgery

## 2023-03-30 VITALS — Ht 69.0 in | Wt 198.0 lb

## 2023-03-30 DIAGNOSIS — M25572 Pain in left ankle and joints of left foot: Secondary | ICD-10-CM | POA: Diagnosis not present

## 2023-03-30 MED ORDER — HYDROCODONE-ACETAMINOPHEN 5-325 MG PO TABS: ORAL_TABLET | ORAL | 0 refills | Status: DC

## 2023-03-30 MED ORDER — PREDNISONE 10 MG PO TABS: ORAL_TABLET | ORAL | 0 refills | Status: DC

## 2023-03-30 NOTE — Progress Notes (Signed)
My ankle hurts bad at times.  He has good results from the prednisone for a few days then has recurrent swelling and pain.  He has no new trauma.  His uric acid was within normal ranges.  He cannot walk well.  He has no redness or numbness.  Left ankle has painful ROM and more pain medially.  He has slight edema.  NV intact.  Encounter Diagnosis  Name Primary?   Pain in left ankle and joints of left foot Yes   I will get MRI of the ankle.    Return in two weeks.  Call if any problem.  Precautions discussed.  Electronically Signed Darreld Mclean, MD 2/13/20258:14 AM

## 2023-03-30 NOTE — Patient Instructions (Signed)
Central scheduling [phone]

## 2023-04-04 ENCOUNTER — Encounter (HOSPITAL_COMMUNITY)
Admission: RE | Admit: 2023-04-04 | Discharge: 2023-04-04 | Disposition: A | Discharge status: Home / Self Care | Payer: No Typology Code available for payment source | Source: Ambulatory Visit | Attending: Internal Medicine | Admitting: Internal Medicine

## 2023-04-04 ENCOUNTER — Other Ambulatory Visit: Payer: Self-pay

## 2023-04-04 ENCOUNTER — Encounter (HOSPITAL_COMMUNITY): Payer: Self-pay

## 2023-04-04 HISTORY — DX: Unspecified osteoarthritis, unspecified site: M19.90

## 2023-04-05 ENCOUNTER — Ambulatory Visit (HOSPITAL_COMMUNITY)
Admission: RE | Admit: 2023-04-05 | Discharge: 2023-04-05 | Disposition: A | Discharge status: Home / Self Care | Payer: No Typology Code available for payment source | Attending: Internal Medicine | Admitting: Internal Medicine

## 2023-04-05 ENCOUNTER — Encounter (HOSPITAL_COMMUNITY): Payer: Self-pay | Admitting: Internal Medicine

## 2023-04-05 ENCOUNTER — Encounter (HOSPITAL_COMMUNITY)
Admission: RE | Disposition: A | Discharge status: Home / Self Care | Payer: Self-pay | Source: Home / Self Care | Attending: Internal Medicine

## 2023-04-05 ENCOUNTER — Ambulatory Visit (HOSPITAL_COMMUNITY): Payer: No Typology Code available for payment source | Admitting: Anesthesiology

## 2023-04-05 ENCOUNTER — Telehealth: Payer: Self-pay

## 2023-04-05 DIAGNOSIS — I8510 Secondary esophageal varices without bleeding: Secondary | ICD-10-CM | POA: Insufficient documentation

## 2023-04-05 DIAGNOSIS — Z79899 Other long term (current) drug therapy: Secondary | ICD-10-CM | POA: Insufficient documentation

## 2023-04-05 DIAGNOSIS — G8929 Other chronic pain: Secondary | ICD-10-CM | POA: Diagnosis not present

## 2023-04-05 DIAGNOSIS — Z87891 Personal history of nicotine dependence: Secondary | ICD-10-CM | POA: Diagnosis not present

## 2023-04-05 DIAGNOSIS — I10 Essential (primary) hypertension: Secondary | ICD-10-CM

## 2023-04-05 DIAGNOSIS — K766 Portal hypertension: Secondary | ICD-10-CM | POA: Insufficient documentation

## 2023-04-05 DIAGNOSIS — B9681 Helicobacter pylori [H. pylori] as the cause of diseases classified elsewhere: Secondary | ICD-10-CM

## 2023-04-05 DIAGNOSIS — K259 Gastric ulcer, unspecified as acute or chronic, without hemorrhage or perforation: Secondary | ICD-10-CM

## 2023-04-05 DIAGNOSIS — K7031 Alcoholic cirrhosis of liver with ascites: Secondary | ICD-10-CM | POA: Diagnosis not present

## 2023-04-05 DIAGNOSIS — K2950 Unspecified chronic gastritis without bleeding: Secondary | ICD-10-CM | POA: Diagnosis not present

## 2023-04-05 DIAGNOSIS — K269 Duodenal ulcer, unspecified as acute or chronic, without hemorrhage or perforation: Secondary | ICD-10-CM | POA: Diagnosis not present

## 2023-04-05 DIAGNOSIS — K3189 Other diseases of stomach and duodenum: Secondary | ICD-10-CM | POA: Insufficient documentation

## 2023-04-05 DIAGNOSIS — K7460 Unspecified cirrhosis of liver: Secondary | ICD-10-CM

## 2023-04-05 HISTORY — PX: BIOPSY: SHX5522

## 2023-04-05 HISTORY — PX: ESOPHAGOGASTRODUODENOSCOPY (EGD) WITH PROPOFOL: SHX5813

## 2023-04-05 SURGERY — ESOPHAGOGASTRODUODENOSCOPY (EGD) WITH PROPOFOL
Anesthesia: General

## 2023-04-05 MED ORDER — PANTOPRAZOLE SODIUM 40 MG PO TBEC: 40.0000 mg | DELAYED_RELEASE_TABLET | Freq: Two times a day (BID) | ORAL | 11 refills | Status: DC

## 2023-04-05 MED ORDER — PROPOFOL 500 MG/50ML IV EMUL
INTRAVENOUS | Status: DC | PRN
  Administered 2023-04-05: 30 mg via INTRAVENOUS
  Administered 2023-04-05: 150 ug/kg/min via INTRAVENOUS
  Administered 2023-04-05: 100 mg via INTRAVENOUS

## 2023-04-05 MED ORDER — LIDOCAINE HCL (PF) 2 % IJ SOLN
INTRAMUSCULAR | Status: DC | PRN
  Administered 2023-04-05: 60 mg via INTRADERMAL

## 2023-04-05 MED ORDER — LACTATED RINGERS IV SOLN: INTRAVENOUS | Status: DC | PRN

## 2023-04-05 NOTE — Discharge Instructions (Addendum)
EGD Discharge instructions Please read the instructions outlined below and refer to this sheet in the next few weeks. These discharge instructions provide you with general information on caring for yourself after you leave the hospital. Your doctor may also give you specific instructions. While your treatment has been planned according to the most current medical practices available, unavoidable complications occasionally occur. If you have any problems or questions after discharge, please call your doctor. ACTIVITY You may resume your regular activity but move at a slower pace for the next 24 hours.  Take frequent rest periods for the next 24 hours.  Walking will help expel (get rid of) the air and reduce the bloated feeling in your abdomen.  No driving for 24 hours (because of the anesthesia (medicine) used during the test).  You may shower.  Do not sign any important legal documents or operate any machinery for 24 hours (because of the anesthesia used during the test).  NUTRITION Drink plenty of fluids.  You may resume your normal diet.  Begin with a light meal and progress to your normal diet.  Avoid alcoholic beverages for 24 hours or as instructed by your caregiver.  MEDICATIONS You may resume your normal medications unless your caregiver tells you otherwise.  WHAT YOU CAN EXPECT TODAY You may experience abdominal discomfort such as a feeling of fullness or "gas" pains.  FOLLOW-UP Your doctor will discuss the results of your test with you.  SEEK IMMEDIATE MEDICAL ATTENTION IF ANY OF THE FOLLOWING OCCUR: Excessive nausea (feeling sick to your stomach) and/or vomiting.  Severe abdominal pain and distention (swelling).  Trouble swallowing.  Temperature over 101 F (37.8 C).  Rectal bleeding or vomiting of blood.     you have no varicose veins in your esophagus  You have multiple ulcers in your stomach and duodenum   please avoid any NSAID medications like aspirin powders Aleve and  ibuprofen is much as possible  Biopsies were taken  Begin new medication Protonix 40 mg orally 30 minutes before breakfast and supper to heal the ulcers.  New prescription already sent to your pharmacy from my office  Further recommendations to follow pending review of pathology report  Office visit with Tana Coast in 3 months  At patient request, called Tylar Amborn at [phone] -  reviewed findings and recommendations

## 2023-04-05 NOTE — Telephone Encounter (Signed)
-----   Message from Eula Listen sent at 04/05/2023 11:06 AM EST -----  new prescription.  Protonix 40 mg twice daily.  Dispense 60 with 11 refills (1)40 mg tablet 30 minutes before breakfast and supper.  Thanks.

## 2023-04-05 NOTE — Interval H&P Note (Signed)
History and Physical Interval Note:  04/05/2023 10:07 AM  Crista Luria  has presented today for surgery, with the diagnosis of screen for esophageal varices.  The various methods of treatment have been discussed with the patient and family. After consideration of risks, benefits and other options for treatment, the patient has consented to  Procedure(s) with comments: ESOPHAGOGASTRODUODENOSCOPY (EGD) WITH PROPOFOL (N/A) - 2:30 pm, asa 3, pt knows to arrive at 7:00 as a surgical intervention.  The patient's history has been reviewed, patient examined, no change in status, stable for surgery.  I have reviewed the patient's chart and labs.  Questions were answered to the patient's satisfaction.     Ramadan Couey   no change.  Saw IR in [place].  Ascites felt to be controlled at this time.  No need for a TIPS.  Screening EGD today per plan The risks, benefits, limitations, alternatives and imponderables have been reviewed with the patient. Potential for esophageal dilation, biopsy, etc. have also been reviewed.  Questions have been answered. All parties agreeable.

## 2023-04-05 NOTE — Transfer of Care (Signed)
Immediate Anesthesia Transfer of Care Note  Patient: Crista Luria  Procedure(s) Performed: ESOPHAGOGASTRODUODENOSCOPY (EGD) WITH PROPOFOL BIOPSY  Patient Location: Endoscopy Unit  Anesthesia Type:General  Level of Consciousness: drowsy  Airway & Oxygen Therapy: Patient Spontanous Breathing  Post-op Assessment: Report given to RN and Post -op Vital signs reviewed and stable  Post vital signs: Reviewed and stable  Last Vitals:  Vitals Value Taken Time  BP 74/37 04/05/23 1032  Temp 36.4 C 04/05/23 1032  Pulse 68 04/05/23 1032  Resp 20 04/05/23 1032  SpO2 100 % 04/05/23 1032    Last Pain:  Vitals:   04/05/23 1032  TempSrc: Oral  PainSc: 0-No pain         Complications: No notable events documented.

## 2023-04-05 NOTE — Anesthesia Preprocedure Evaluation (Signed)
Anesthesia Evaluation  Patient identified by MRN, date of birth, ID band Patient awake    Reviewed: Allergy & Precautions, H&P , NPO status , Patient's Chart, lab work & pertinent test results, reviewed documented beta blocker date and time   Airway Mallampati: II  TM Distance: >3 FB Neck ROM: full    Dental no notable dental hx.    Pulmonary pneumonia, former smoker   Pulmonary exam normal breath sounds clear to auscultation       Cardiovascular Exercise Tolerance: Good hypertension, negative cardio ROS  Rhythm:regular Rate:Normal     Neuro/Psych negative neurological ROS  negative psych ROS   GI/Hepatic negative GI ROS,,,(+) Cirrhosis         Endo/Other  negative endocrine ROS    Renal/GU negative Renal ROS  negative genitourinary   Musculoskeletal   Abdominal   Peds  Hematology negative hematology ROS (+)   Anesthesia Other Findings   Reproductive/Obstetrics negative OB ROS                             Anesthesia Physical Anesthesia Plan  ASA: 3  Anesthesia Plan: General   Post-op Pain Management:    Induction:   PONV Risk Score and Plan: Propofol infusion  Airway Management Planned:   Additional Equipment:   Intra-op Plan:   Post-operative Plan:   Informed Consent: I have reviewed the patients History and Physical, chart, labs and discussed the procedure including the risks, benefits and alternatives for the proposed anesthesia with the patient or authorized representative who has indicated his/her understanding and acceptance.     Dental Advisory Given  Plan Discussed with: CRNA  Anesthesia Plan Comments:        Anesthesia Quick Evaluation

## 2023-04-05 NOTE — Telephone Encounter (Signed)
 Rx sent to pharmacy on file.

## 2023-04-05 NOTE — Op Note (Signed)
[facility] Patient Name: Billy Chung Procedure Date: 04/05/2023 9:55 AM MRN: [id number] Date of Birth: 1960/08/09 Attending MD: Gennette Pac , MD, [id number] CSN: [id number] Age: 63 Admit Type: Outpatient Procedure:                Upper GI endoscopy Indications:              Cirrhosis with suspected esophageal varices Providers:                Gennette Pac, MD, Sheran Fava, Elinor Parkinson Referring MD:              Medicines:                Propofol per Anesthesia Complications:            No immediate complications. Estimated Blood Loss:     Estimated blood loss was minimal. Procedure:                Pre-Anesthesia Assessment:                           - Prior to the procedure, a History and Physical                            was performed, and patient medications and                            allergies were reviewed. The patient's tolerance of                            previous anesthesia was also reviewed. The risks                            and benefits of the procedure and the sedation                            options and risks were discussed with the patient.                            All questions were answered, and informed consent                            was obtained. Prior Anticoagulants: The patient has                            taken no anticoagulant or antiplatelet agents. ASA                            Grade Assessment: III - A patient with severe                            systemic disease. After reviewing the risks and  benefits, the patient was deemed in satisfactory                            condition to undergo the procedure.                           After obtaining informed consent, the endoscope was                            passed under direct vision. Throughout the                            procedure, the patient's blood pressure, pulse, and                             oxygen saturations were monitored continuously. The                            [id number]-[id number] ([id number]) scope was introduced through the                            mouth, and advanced to the second part of duodenum.                            The upper GI endoscopy was accomplished without                            difficulty. The patient tolerated the procedure                            well. Scope In: 10:20:41 AM Scope Out: 10:28:27 AM Total Procedure Duration: 0 hours 7 minutes 46 seconds  Findings:      The examined esophagus was normal.      Moderate portal hypertensive gastropathy was found in the entire       examined stomach. Multiple 5 to 8 mm antral erosions with overlying       hematin no active bleeding. No gastric varices. Examination bulb second       portion revealed similar sized 9 cratered ulcers in the bulb and the       proximal second portion. Please see photos. This was biopsied with a       cold forceps for histology. Estimated blood loss was minimal. Impression:               - Normal esophagus.                           - Portal hypertensive gastropathy. Multiple gastric                            and duodenal ulcers - biopsied. Moderate Sedation:      Moderate (conscious) sedation was personally administered by an       anesthesia professional. The following parameters were monitored: oxygen       saturation, heart rate, blood pressure, respiratory rate, EKG, adequacy       of pulmonary ventilation, and  response to care. Recommendation:           - Patient has a contact number available for                            emergencies. The signs and symptoms of potential                            delayed complications were discussed with the                            patient. Return to normal activities tomorrow.                            Written discharge instructions were provided to the                            patient.                           - Advance  diet as tolerated.                           - Continue present medications. Avoid any NSAIDs                            patient may be taking. Begin Protonix 40 mg orally                            twice daily 30 minutes before breakfast and supper                           - Return to my office in 3 months. Follow-up on                            pathology. Procedure Code(s):        --- Professional ---                           [phone], Esophagogastroduodenoscopy, flexible,                            transoral; with biopsy, single or multiple Diagnosis Code(s):        --- Professional ---                           K76.6, Portal hypertension                           K31.89, Other diseases of stomach and duodenum                           K74.60, Unspecified cirrhosis of liver CPT copyright 2022 American Medical Association. All rights reserved. The codes documented in this report are preliminary and upon coder review may  be revised to meet current compliance requirements. Gerrit Friends. Lyonel Morejon, MD Gennette Pac,  MD 04/05/2023 11:02:37 AM This report has been signed electronically. Number of Addenda: 0

## 2023-04-06 ENCOUNTER — Encounter (HOSPITAL_COMMUNITY): Payer: Self-pay | Admitting: Internal Medicine

## 2023-04-06 LAB — SURGICAL PATHOLOGY

## 2023-04-07 ENCOUNTER — Ambulatory Visit (HOSPITAL_COMMUNITY): Payer: No Typology Code available for payment source

## 2023-04-07 ENCOUNTER — Telehealth: Payer: Self-pay | Admitting: Orthopaedic Surgery

## 2023-04-07 NOTE — Anesthesia Postprocedure Evaluation (Signed)
Anesthesia Post Note  Patient: Billy Chung  Procedure(s) Performed: ESOPHAGOGASTRODUODENOSCOPY (EGD) WITH PROPOFOL BIOPSY  Patient location during evaluation: Phase II Anesthesia Type: General Level of consciousness: awake Pain management: pain level controlled Vital Signs Assessment: post-procedure vital signs reviewed and stable Respiratory status: spontaneous breathing and respiratory function stable Cardiovascular status: blood pressure returned to baseline and stable Postop Assessment: no headache and no apparent nausea or vomiting Anesthetic complications: no Comments: Late entry   No notable events documented.   Last Vitals:  Vitals:   04/05/23 1032 04/05/23 1040  BP: (!) 74/37 102/66  Pulse: 68   Resp: 20   Temp: 36.4 C   SpO2: 100%     Last Pain:  Vitals:   04/05/23 1032  TempSrc: Oral  PainSc: 0-No pain                 Windell Norfolk

## 2023-04-07 NOTE — Telephone Encounter (Signed)
Dr. Sanjuan Dame pt Billy Chung w/the [facility] [phone] ext [phone] called, stated that the pt's auth for his MRI was denied.  It was supposed to be today.

## 2023-04-07 NOTE — Telephone Encounter (Signed)
He came by the office just now to let us know it was denied. Would like to find out why and please give him a call

## 2023-04-09 ENCOUNTER — Encounter: Payer: Self-pay | Admitting: Internal Medicine

## 2023-04-10 ENCOUNTER — Other Ambulatory Visit: Payer: Self-pay

## 2023-04-10 MED ORDER — BISMUTH/METRONIDAZ/TETRACYCLIN 140-125-125 MG PO CAPS: 3.0000 | ORAL_CAPSULE | Freq: Four times a day (QID) | ORAL | 0 refills | Status: DC

## 2023-04-10 NOTE — Telephone Encounter (Signed)
 [place] [phone] extention [phone] for Archie Patten and her voice mail said she is out of the office  I left message that I need  fax info to fax records to try to get his MRI approved   Called the main number as well to speak to a associate and went to voice mail as swell

## 2023-04-11 ENCOUNTER — Telehealth: Payer: Self-pay

## 2023-04-11 DIAGNOSIS — M25572 Pain in left ankle and joints of left foot: Secondary | ICD-10-CM

## 2023-04-11 NOTE — Telephone Encounter (Signed)
 Spoke w/the pt, he wants to do PT in [place] and stay w/[facility], he said to call him if you need anything else.  [phone]

## 2023-04-11 NOTE — Addendum Note (Signed)
 Addended by: Michaele Offer on: 04/11/2023 02:06 PM   Modules accepted: Orders

## 2023-04-11 NOTE — Telephone Encounter (Signed)
 LVM letting patient know that I spoke to his insurance and he has to have 4 weeks of Physical therapy and to call the office so I can find out where he wants to have PT at and to get his scheduled

## 2023-04-12 ENCOUNTER — Ambulatory Visit: Payer: No Typology Code available for payment source | Admitting: Nutrition

## 2023-04-19 ENCOUNTER — Ambulatory Visit (INDEPENDENT_AMBULATORY_CARE_PROVIDER_SITE_OTHER): Payer: No Typology Code available for payment source | Admitting: Orthopaedic Surgery

## 2023-04-19 ENCOUNTER — Encounter: Payer: Self-pay | Admitting: Orthopaedic Surgery

## 2023-04-19 VITALS — BP 102/53 | HR 62 | Ht 69.0 in | Wt 198.0 lb

## 2023-04-19 DIAGNOSIS — M25572 Pain in left ankle and joints of left foot: Secondary | ICD-10-CM | POA: Diagnosis not present

## 2023-04-19 MED ORDER — HYDROCODONE-ACETAMINOPHEN 5-325 MG PO TABS: ORAL_TABLET | ORAL | 0 refills | Status: DC

## 2023-04-19 MED ORDER — PREDNISONE 5 MG (21) PO TBPK: ORAL_TABLET | Freq: Every day | ORAL | 0 refills | Status: DC

## 2023-04-19 NOTE — Progress Notes (Signed)
 My ankle still hurts.  He was denied MRI for reasons hard to understand.  He has had documented problem with the ankle since the end of January.  I tried to set up PT, but it was denied.  Then MRI denied as he did not go to PT.  I will put in MRI request again.  I will begin prednisone 5 mgm daily.  He did well on the 10 mgm and I will taper down to 5.  I will renew pain medicine.  His left ankle is tender, has swelling, ROM painful, limp left.  NV intact.  Encounter Diagnosis  Name Primary?   Pain in left ankle and joints of left foot Yes   Return in six weeks.  Try to get MRI.  He is changing to [facility] on April 1.  We can try that insurance if his current insurance keeps denying.  Call if any problem.  Precautions discussed.  Electronically Signed Darreld Mclean, MD 3/5/20258:15 AM

## 2023-04-29 ENCOUNTER — Other Ambulatory Visit: Payer: Self-pay | Admitting: Urology

## 2023-04-29 DIAGNOSIS — N5082 Scrotal pain: Secondary | ICD-10-CM

## 2023-05-01 NOTE — Progress Notes (Unsigned)
 GI Office Note    Referring Provider: Billie Lade, MD Primary Care Physician:  Billie Lade, MD  Primary Gastroenterologist: Roetta Sessions, MD   Chief Complaint   No chief complaint on file.   History of Present Illness   Billy Chung is a 63 y.o. male presenting today for follow up. Last seen 02/2023. H/o recently decompensated etoh related cirrhosis manifested as anasarca/ascites. He has required multiple large vomume paracentesiss since ***  Stopped drinking etoh cold [place] in 11/2023, went through DTs. Large quantities of beer over the years, (5DWIs). Continues to work as Engineering geologist.   Immunoglobulins elevated as was ANA and anti-smooth muscle antibody AMA negative.  Iron studies elevated felt to be acute phase reactant related to alcohol. All taps have been negative for SBP.  Advised to get Hep B vaccine.   Plans to repeat autoimmune and iron markders in 3 months***depending on cmet per rmr***  Completed EGD as outlined below. Treated with Pylera for H.pylori gastritis.   MELD 3.0 of 14. Referred to portal hypertension clinic. Saw Dr. Deanne Coffer with IR. At this time it was felt that he is not a candidate for TIPS given controlled ascites and elevated MELD. Required 8 paracenteses since 10/24 but last one two months ago.   EGD 03/2023: -normal esophagus -portal hypertensive gastropathy -multiple gastric and duodenal ulcers s/p bx. H pylori on bx. -pantoprazole 40mg  BID before meals   Labs MELD 3.0: 14 on *** [place]: no liver lesions by CT 12/03/22 AFP:  6.3 on 12/07/22 Hep A/B vaccination: immune to Hep A, will need Hep B EGD:  03/2023, no varices. Noted portal htn gastropaty, gastric/duodenal ulcers with h.pylori on bx BB: no Ascites/peripheral edema: Yes, presented with decompensation, anasarca. Last paracentesis, 03/03/23 Diuretics: spironolactone 100mg  AM, 50mg  PM, furosemide 40mg  AM, 20mg  PM Paracentesis: yes, first on 12/07/22, has standing  order History of SBP: No Encephalopathy:  No  Medications   Current Outpatient Medications  Medication Sig Dispense Refill   Bismuth/Metronidaz/Tetracyclin (PYLERA) 140-125-125 MG CAPS Take 3 capsules by mouth in the morning, at noon, in the evening, and at bedtime for 10 days. 120 capsule 0   Cholecalciferol (VITAMIN D3) 50 MCG (2000 UT) capsule Take 2,000 Units by mouth daily.     furosemide (LASIX) 40 MG tablet Take 40mg  in am and 20mg  in pm 45 tablet 5   HYDROcodone-acetaminophen (NORCO/VICODIN) 5-325 MG tablet One tablet every four hours for pain. 30 tablet 0   pantoprazole (PROTONIX) 40 MG tablet Take 1 tablet (40 mg total) by mouth 2 (two) times daily before a meal. 60 tablet 11   polyethylene glycol powder (GLYCOLAX/MIRALAX) 17 GM/SCOOP powder Take 1 Container by mouth daily as needed.     predniSONE (STERAPRED UNI-PAK 21 TAB) 5 MG (21) TBPK tablet Take by mouth daily. 30 tablet 0   spironolactone (ALDACTONE) 100 MG tablet Take 100mg  in am and 50mg  in pm 45 tablet 5   No current facility-administered medications for this visit.    Allergies   Allergies as of 05/02/2023 - Review Complete 04/19/2023  Allergen Reaction Noted   No known allergies  11/04/2015     Past Medical History   Past Medical History:  Diagnosis Date   Alcohol abuse    Arthritis    plate and screws in right ankle   Cirrhosis (HCC)    Pneumonia     Past Surgical History   Past Surgical History:  Procedure Laterality Date   APPENDECTOMY  BIOPSY  04/05/2023   Procedure: BIOPSY;  Surgeon: Corbin Ade, MD;  Location: [facility];  Service: Endoscopy;;   ESOPHAGOGASTRODUODENOSCOPY (EGD) WITH PROPOFOL N/A 04/05/2023   Procedure: ESOPHAGOGASTRODUODENOSCOPY (EGD) WITH PROPOFOL;  Surgeon: Corbin Ade, MD;  Location: [facility];  Service: Endoscopy;  Laterality: N/A;  2:30 pm, asa 3, pt knows to arrive at 7:00   ORIF ANKLE FRACTURE Right 11/04/2015   Procedure: OPEN REDUCTION INTERNAL  FIXATION (ORIF) ANKLE FRACTURE;  Surgeon: Nadara Mustard, MD;  Location: [facility] OR;  Service: Orthopedics;  Laterality: Right;    Past Family History   Family History  Problem Relation Age of Onset   Cirrhosis Maternal Grandmother        no etoh   Cirrhosis Maternal Aunt        no etoh   Colon cancer Neg Hx     Past Social History   Social History   Socioeconomic History   Marital status: Single    Spouse name: Not on file   Number of children: Not on file   Years of education: Not on file   Highest education level: Not on file  Occupational History   Not on file  Tobacco Use   Smoking status: Former   Smokeless tobacco: Former    Types: Engineer, drilling   Vaping status: Never Used  Substance and Sexual Activity   Alcohol use: Not Currently    Alcohol/week: 168.0 standard drinks of alcohol    Types: 168 Cans of beer per week    Comment: daily (stopped 11/24/22)   Drug use: Not Currently    Types: Cocaine    Comment: former drug use (2 years ago)   Sexual activity: Yes  Other Topics Concern   Not on file  Social History Narrative   Not on file   Social Drivers of Health   Financial Resource Strain: Not on file  Food Insecurity: Not on file  Transportation Needs: Not on file  Physical Activity: Not on file  Stress: Not on file  Social Connections: Not on file  Intimate Partner Violence: Not on file    Review of Systems   General: Negative for anorexia, weight loss, fever, chills, fatigue, weakness. ENT: Negative for hoarseness, difficulty swallowing , nasal congestion. CV: Negative for chest pain, angina, palpitations, dyspnea on exertion, peripheral edema.  Respiratory: Negative for dyspnea at rest, dyspnea on exertion, cough, sputum, wheezing.  GI: See history of present illness. GU:  Negative for dysuria, hematuria, urinary incontinence, urinary frequency, nocturnal urination.  Endo: Negative for unusual weight change.     Physical Exam   There were no  vitals taken for this visit.   General: Well-nourished, well-developed in no acute distress.  Eyes: No icterus. Mouth: Oropharyngeal mucosa moist and pink , no lesions erythema or exudate. Lungs: Clear to auscultation bilaterally.  Heart: Regular rate and rhythm, no murmurs rubs or gallops.  Abdomen: Bowel sounds are normal, nontender, nondistended, no hepatosplenomegaly or masses,  no abdominal bruits or hernia , no rebound or guarding.  Rectal: ***  Extremities: No lower extremity edema. No clubbing or deformities. Neuro: Alert and oriented x 4   Skin: Warm and dry, no jaundice.   Psych: Alert and cooperative, normal mood and affect.  Labs   *** Imaging Studies   No results found.  Assessment       PLAN   ***   Leanna Battles. Melvyn Neth, MHS, PA-C [facility]

## 2023-05-02 ENCOUNTER — Encounter: Payer: Self-pay | Admitting: Gastroenterology

## 2023-05-02 ENCOUNTER — Ambulatory Visit (INDEPENDENT_AMBULATORY_CARE_PROVIDER_SITE_OTHER): Payer: No Typology Code available for payment source | Admitting: Gastroenterology

## 2023-05-02 VITALS — BP 120/65 | HR 64 | Temp 98.4°F | Ht 68.0 in | Wt 193.4 lb

## 2023-05-02 DIAGNOSIS — K5903 Drug induced constipation: Secondary | ICD-10-CM

## 2023-05-02 DIAGNOSIS — R601 Generalized edema: Secondary | ICD-10-CM

## 2023-05-02 DIAGNOSIS — F1090 Alcohol use, unspecified, uncomplicated: Secondary | ICD-10-CM | POA: Diagnosis not present

## 2023-05-02 DIAGNOSIS — Z79891 Long term (current) use of opiate analgesic: Secondary | ICD-10-CM

## 2023-05-02 DIAGNOSIS — K7031 Alcoholic cirrhosis of liver with ascites: Secondary | ICD-10-CM

## 2023-05-02 MED ORDER — PANTOPRAZOLE SODIUM 40 MG PO TBEC: 40.0000 mg | DELAYED_RELEASE_TABLET | Freq: Two times a day (BID) | ORAL | 11 refills | Status: AC

## 2023-05-02 MED ORDER — FUROSEMIDE 40 MG PO TABS
ORAL_TABLET | ORAL | 5 refills | Status: DC
Start: 2023-05-02 — End: 2023-08-29

## 2023-05-02 MED ORDER — SPIRONOLACTONE 100 MG PO TABS: ORAL_TABLET | ORAL | 5 refills | Status: DC

## 2023-05-02 NOTE — Patient Instructions (Addendum)
 Continue pantoprazole 40mg  twice daily before breakfast and evening meal. Continue furosemide 40mg  in AM and 20mg  in PM. We may adjust after your labs return. Continue spironolactone 100mg  in AM and 50mg  in PM. We may adjust after your labs return. Continue to limit sodium intake to no more than 2000mg  daily. Continue to avoid all alcohol.  Update labs at your convenience. We will plan for colonoscopy once your ankle is better.  You will be due follow up liver ultrasound in April, we do these every six months. Add miralax one capful twice daily for constipation. Once soft stool, then you can reduce to once daily. Purchase OTC. Return to the office in 3 months or sooner if needed.

## 2023-05-03 LAB — CBC WITH DIFFERENTIAL/PLATELET
Basophils Absolute: 0 10*3/uL (ref 0.0–0.2)
Basos: 1 %
EOS (ABSOLUTE): 0 10*3/uL (ref 0.0–0.4)
Eos: 0 %
Hematocrit: 37.8 % (ref 37.5–51.0)
Hemoglobin: 13.1 g/dL (ref 13.0–17.7)
Immature Grans (Abs): 0 10*3/uL (ref 0.0–0.1)
Immature Granulocytes: 1 %
Lymphocytes Absolute: 0.9 10*3/uL (ref 0.7–3.1)
Lymphs: 19 %
MCH: 33.6 pg — ABNORMAL HIGH (ref 26.6–33.0)
MCHC: 34.7 g/dL (ref 31.5–35.7)
MCV: 97 fL (ref 79–97)
Monocytes Absolute: 0.3 10*3/uL (ref 0.1–0.9)
Monocytes: 6 %
Neutrophils Absolute: 3.7 10*3/uL (ref 1.4–7.0)
Neutrophils: 73 %
Platelets: 141 10*3/uL — ABNORMAL LOW (ref 150–450)
RBC: 3.9 x10E6/uL — ABNORMAL LOW (ref 4.14–5.80)
RDW: 14.5 % (ref 11.6–15.4)
WBC: 4.9 10*3/uL (ref 3.4–10.8)

## 2023-05-03 LAB — IGG, IGA, IGM
IgA/Immunoglobulin A, Serum: 784 mg/dL — ABNORMAL HIGH (ref 61–437)
IgG (Immunoglobin G), Serum: 1782 mg/dL — ABNORMAL HIGH (ref 603–1613)
IgM (Immunoglobulin M), Srm: 236 mg/dL — ABNORMAL HIGH (ref 20–172)

## 2023-05-03 LAB — MITOCHONDRIAL/SMOOTH MUSCLE AB PNL
Mitochondrial Ab: <20 U (ref 0.0–20.0)
Smooth Muscle Ab: 76 U — ABNORMAL HIGH (ref 0–19)

## 2023-05-03 LAB — IRON,TIBC AND FERRITIN PANEL
Ferritin: 244 ng/mL (ref 30–400)
Iron Saturation: 66 % — ABNORMAL HIGH (ref 15–55)
Iron: 189 ug/dL — ABNORMAL HIGH (ref 38–169)
Total Iron Binding Capacity: 285 ug/dL (ref 250–450)
UIBC: 96 ug/dL — ABNORMAL LOW (ref 111–343)

## 2023-05-03 LAB — COMPREHENSIVE METABOLIC PANEL
ALT: 34 IU/L (ref 0–44)
AST: 49 IU/L — ABNORMAL HIGH (ref 0–40)
Albumin: 3.4 g/dL — ABNORMAL LOW (ref 3.9–4.9)
Alkaline Phosphatase: 105 IU/L (ref 44–121)
BUN/Creatinine Ratio: 14 (ref 10–24)
BUN: 13 mg/dL (ref 8–27)
Bilirubin Total: 2.8 mg/dL — ABNORMAL HIGH (ref 0.0–1.2)
CO2: 20 mmol/L (ref 20–29)
Calcium: 9.3 mg/dL (ref 8.6–10.2)
Chloride: 103 mmol/L (ref 96–106)
Creatinine, Ser: 0.95 mg/dL (ref 0.76–1.27)
Globulin, Total: 3.3 g/dL (ref 1.5–4.5)
Glucose: 133 mg/dL — ABNORMAL HIGH (ref 70–99)
Potassium: 4.2 mmol/L (ref 3.5–5.2)
Sodium: 135 mmol/L (ref 134–144)
Total Protein: 6.7 g/dL (ref 6.0–8.5)
eGFR: 89 mL/min/{1.73_m2} (ref >59)

## 2023-05-03 LAB — AFP TUMOR MARKER: AFP, Serum, Tumor Marker: 10.8 ng/mL — ABNORMAL HIGH (ref 0.0–8.4)

## 2023-05-03 LAB — PROTIME-INR
INR: 1.2 (ref 0.9–1.2)
Prothrombin Time: 13.5 s — ABNORMAL HIGH (ref 9.1–12.0)

## 2023-05-03 LAB — CERULOPLASMIN: Ceruloplasmin: 23.6 mg/dL (ref 16.0–31.0)

## 2023-05-03 LAB — ANA: Anti Nuclear Antibody (ANA): NEGATIVE

## 2023-05-08 ENCOUNTER — Other Ambulatory Visit: Payer: Self-pay

## 2023-05-08 DIAGNOSIS — R772 Abnormality of alphafetoprotein: Secondary | ICD-10-CM

## 2023-05-10 ENCOUNTER — Other Ambulatory Visit: Payer: Self-pay | Admitting: *Deleted

## 2023-05-10 DIAGNOSIS — R772 Abnormality of alphafetoprotein: Secondary | ICD-10-CM

## 2023-05-16 ENCOUNTER — Ambulatory Visit (HOSPITAL_COMMUNITY): Payer: Self-pay

## 2023-05-17 ENCOUNTER — Encounter: Payer: Self-pay | Admitting: Gastroenterology

## 2023-05-17 ENCOUNTER — Ambulatory Visit (HOSPITAL_COMMUNITY)
Admission: RE | Admit: 2023-05-17 | Discharge: 2023-05-17 | Disposition: A | Discharge status: Home / Self Care | Source: Ambulatory Visit | Attending: Orthopaedic Surgery | Admitting: Orthopaedic Surgery

## 2023-05-17 ENCOUNTER — Ambulatory Visit (HOSPITAL_COMMUNITY)
Admission: RE | Admit: 2023-05-17 | Discharge: 2023-05-17 | Disposition: A | Discharge status: Home / Self Care | Source: Ambulatory Visit | Attending: Gastroenterology | Admitting: Gastroenterology

## 2023-05-17 DIAGNOSIS — M25572 Pain in left ankle and joints of left foot: Secondary | ICD-10-CM | POA: Diagnosis present

## 2023-05-17 DIAGNOSIS — R772 Abnormality of alphafetoprotein: Secondary | ICD-10-CM | POA: Insufficient documentation

## 2023-05-17 MED ORDER — GADOBUTROL 1 MMOL/ML IV SOLN
10.0000 mL | Freq: Once | INTRAVENOUS | Status: AC | PRN
  Administered 2023-05-17: 10 mL via INTRAVENOUS

## 2023-05-18 ENCOUNTER — Ambulatory Visit (HOSPITAL_COMMUNITY)

## 2023-05-23 ENCOUNTER — Other Ambulatory Visit: Payer: Self-pay

## 2023-05-23 ENCOUNTER — Telehealth: Payer: Self-pay | Admitting: *Deleted

## 2023-05-23 DIAGNOSIS — K7031 Alcoholic cirrhosis of liver with ascites: Secondary | ICD-10-CM

## 2023-05-23 DIAGNOSIS — R772 Abnormality of alphafetoprotein: Secondary | ICD-10-CM

## 2023-05-23 NOTE — Telephone Encounter (Signed)
 Pt called in as he received his letter to schedule his RUQ [place].  Called pt back and gave appointment details

## 2023-05-24 ENCOUNTER — Ambulatory Visit (INDEPENDENT_AMBULATORY_CARE_PROVIDER_SITE_OTHER): Payer: PRIVATE HEALTH INSURANCE | Admitting: Orthopaedic Surgery

## 2023-05-24 ENCOUNTER — Encounter: Payer: Self-pay | Admitting: Orthopaedic Surgery

## 2023-05-24 DIAGNOSIS — M25572 Pain in left ankle and joints of left foot: Secondary | ICD-10-CM

## 2023-05-24 MED ORDER — HYDROCODONE-ACETAMINOPHEN 5-325 MG PO TABS: ORAL_TABLET | ORAL | 0 refills | Status: AC

## 2023-05-24 NOTE — Addendum Note (Signed)
 Addended by: Elvina Mattes T on: 05/24/2023 03:07 PM   Modules accepted: Orders

## 2023-05-24 NOTE — Progress Notes (Signed)
 My ankle is worse.  He had MRI of the left ankle one week ago today and the report is still not read.  Left ankle has tenderness diffusely with more lateral swelling.  NV intact.  He uses a cane to walk.  He limps to the left.  There is no redness.  Encounter Diagnosis  Name Primary?   Pain in left ankle and joints of left foot Yes   I need the MRI.  I will see him in one week.  I will refill his pain medicine.  I have reviewed the [place] Controlled Substance Reporting System web site prior to prescribing narcotic medicine for this patient.  Call if any problem.  Precautions discussed.  Electronically Signed Darreld Mclean, MD 4/9/20258:37 AM

## 2023-05-24 NOTE — Patient Instructions (Signed)
 Make sure you have MRI report of ankle

## 2023-05-31 ENCOUNTER — Encounter: Payer: Self-pay | Admitting: Orthopaedic Surgery

## 2023-05-31 ENCOUNTER — Ambulatory Visit: Admitting: Orthopaedic Surgery

## 2023-05-31 ENCOUNTER — Ambulatory Visit: Payer: PRIVATE HEALTH INSURANCE | Admitting: Orthopaedic Surgery

## 2023-05-31 DIAGNOSIS — M25571 Pain in right ankle and joints of right foot: Secondary | ICD-10-CM

## 2023-05-31 DIAGNOSIS — G8929 Other chronic pain: Secondary | ICD-10-CM

## 2023-05-31 DIAGNOSIS — M25572 Pain in left ankle and joints of left foot: Secondary | ICD-10-CM

## 2023-05-31 MED ORDER — PREDNISONE 5 MG (21) PO TBPK: ORAL_TABLET | ORAL | 0 refills | Status: DC

## 2023-05-31 NOTE — Progress Notes (Signed)
 My ankle is still hurting.  He had the MRI of the ankle on the left and we finally got the report.  It showed: IMPRESSION: 1. Moderate peroneus longus and brevis tenosynovitis. There is a longitudinal split tear of the peroneus brevis tendon starting at the level of the distal fibular metadiaphysis involving an approximate 6 cm length of the tendon. The majority of this is partial-thickness, however there are two separate punctate regions near the distal fibula where there appear to be a full-thickness defects extending through the posterior and anterior tendon surfaces. 2. Mild longitudinal intermediate T2 signal within the anterior, mid transverse aspect of the Achilles tendon centered approximately 2.7 cm proximal to the distal tendon insertion on the calcaneus. This suggests tendinosis with a possible tiny chronic partial-thickness tear measuring only 2 mm in transverse dimension. 3. Mild posterior tibial tenosynovitis. 4. 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 5. Moderate posterior and mild middle subtalar joint osteoarthritis. 6. Moderate talonavicular, navicular-cuneiform, and tarsometatarsal joint osteoarthritis. 7. 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 8. There is a lobular 11 mm likely ganglion bordering the distal lateral aspect of the cuboid, possibly originating from the fifth tarsometatarsal joint. There is a 1.7 cm likely ganglion extending laterally from the inferior aspect of the sinus tarsi.  I have independently reviewed the MRI.    I had called him and told him I would make appointment to see Dr. Julio Ohm for his ankle problem.  He wanted to see me again today first.  He wants to go to [facility].  I will send him there.  He says he has an appointment already.  He has no new trauma.  He is using a cane.  He has swelling and pain of the left ankle and foot.  It is diffuse.  NV intact.  Encounter Diagnoses   Name Primary?   Pain in left ankle and joints of left foot Yes   Chronic pain of both ankles    Keep his ortho appointment.  I will call in one final prednisone dose pack.  I will see as needed.  Electronically Signed Pleasant Brilliant, MD 4/16/20258:14 AM

## 2023-05-31 NOTE — Patient Instructions (Signed)
 We are referring you to [facility] from [facility] address is [address] [place] The phone number is [phone]  The office will call you with an appointment Dr. Julio Ohm is the ankle specialist

## 2023-06-02 ENCOUNTER — Ambulatory Visit (HOSPITAL_COMMUNITY)
Admission: RE | Admit: 2023-06-02 | Discharge: 2023-06-02 | Disposition: A | Discharge status: Home / Self Care | Payer: PRIVATE HEALTH INSURANCE | Source: Ambulatory Visit | Attending: Gastroenterology | Admitting: Gastroenterology

## 2023-06-02 DIAGNOSIS — K7031 Alcoholic cirrhosis of liver with ascites: Secondary | ICD-10-CM | POA: Diagnosis present

## 2023-06-08 ENCOUNTER — Encounter (HOSPITAL_COMMUNITY): Payer: Self-pay

## 2023-06-08 ENCOUNTER — Ambulatory Visit (HOSPITAL_COMMUNITY): Admit: 2023-06-08 | Admitting: Internal Medicine

## 2023-06-08 SURGERY — COLONOSCOPY
Anesthesia: Choice

## 2023-06-13 ENCOUNTER — Other Ambulatory Visit (HOSPITAL_COMMUNITY): Payer: Self-pay | Admitting: Orthopaedic Surgery

## 2023-06-13 DIAGNOSIS — Z01818 Encounter for other preprocedural examination: Secondary | ICD-10-CM

## 2023-06-17 ENCOUNTER — Ambulatory Visit (HOSPITAL_COMMUNITY)
Admission: RE | Admit: 2023-06-17 | Discharge: 2023-06-17 | Disposition: A | Discharge status: Home / Self Care | Source: Ambulatory Visit | Attending: Orthopaedic Surgery | Admitting: Orthopaedic Surgery

## 2023-06-17 DIAGNOSIS — Z01818 Encounter for other preprocedural examination: Secondary | ICD-10-CM | POA: Diagnosis present

## 2023-07-06 DIAGNOSIS — M19079 Primary osteoarthritis, unspecified ankle and foot: Secondary | ICD-10-CM | POA: Insufficient documentation

## 2023-07-06 DIAGNOSIS — M65979 Unspecified synovitis and tenosynovitis, unspecified ankle and foot: Secondary | ICD-10-CM | POA: Insufficient documentation

## 2023-07-06 DIAGNOSIS — G8929 Other chronic pain: Secondary | ICD-10-CM | POA: Insufficient documentation

## 2023-07-19 ENCOUNTER — Other Ambulatory Visit (HOSPITAL_COMMUNITY): Payer: Self-pay | Admitting: Orthopaedic Surgery

## 2023-07-19 ENCOUNTER — Ambulatory Visit (HOSPITAL_COMMUNITY)
Admission: RE | Admit: 2023-07-19 | Discharge: 2023-07-19 | Disposition: A | Discharge status: Home / Self Care | Payer: PRIVATE HEALTH INSURANCE | Source: Ambulatory Visit | Attending: Vascular Surgery | Admitting: Vascular Surgery

## 2023-07-19 DIAGNOSIS — M7989 Other specified soft tissue disorders: Secondary | ICD-10-CM | POA: Diagnosis not present

## 2023-07-19 DIAGNOSIS — M79605 Pain in left leg: Secondary | ICD-10-CM | POA: Diagnosis present

## 2023-08-07 ENCOUNTER — Telehealth: Payer: Self-pay

## 2023-08-07 NOTE — Telephone Encounter (Signed)
 Copied from CRM [id number]. Topic: General - Other >> Aug 07, 2023  9:13 AM Wess RAMAN wrote: Reason for CRM: Patient would like to know if he should be doing his EKG at his appointment on 7/8.  Callback #: [phone]

## 2023-08-08 LAB — BASIC METABOLIC PANEL WITH GFR
BUN/Creatinine Ratio: 11 (ref 10–24)
BUN: 12 mg/dL (ref 8–27)
CO2: 18 mmol/L — ABNORMAL LOW (ref 20–29)
Calcium: 8.8 mg/dL (ref 8.6–10.2)
Chloride: 103 mmol/L (ref 96–106)
Creatinine, Ser: 1.05 mg/dL (ref 0.76–1.27)
Glucose: 99 mg/dL (ref 70–99)
Potassium: 3.8 mmol/L (ref 3.5–5.2)
Sodium: 137 mmol/L (ref 134–144)
eGFR: 79 mL/min/{1.73_m2} (ref >59)

## 2023-08-16 ENCOUNTER — Other Ambulatory Visit: Payer: Self-pay

## 2023-08-16 DIAGNOSIS — R772 Abnormality of alphafetoprotein: Secondary | ICD-10-CM

## 2023-08-17 ENCOUNTER — Ambulatory Visit: Payer: Self-pay | Admitting: Gastroenterology

## 2023-08-22 ENCOUNTER — Ambulatory Visit

## 2023-08-22 VITALS — BP 114/67 | HR 60 | Ht 69.0 in | Wt 210.0 lb

## 2023-08-22 DIAGNOSIS — Z01818 Encounter for other preprocedural examination: Secondary | ICD-10-CM | POA: Diagnosis not present

## 2023-08-22 DIAGNOSIS — M19171 Post-traumatic osteoarthritis, right ankle and foot: Secondary | ICD-10-CM

## 2023-08-22 LAB — EKG 12-LEAD

## 2023-08-22 NOTE — Assessment & Plan Note (Signed)
-   Unremarkable exam in office. -Will obtain CBC and CMP as requested by his surgeon, Dr. Nigel.   - EKG performed in office today was negative for any ischemic changes or arrhythmias. -We will fax over these results once they are available.

## 2023-08-22 NOTE — Assessment & Plan Note (Signed)
 He has upcoming ankle surgery on 09/26/23 with Dr. Jacki Socks, [facility]

## 2023-08-22 NOTE — Progress Notes (Signed)
 Established Patient Office Visit  Subjective   Patient ID: Billy Chung, male    DOB: [birth year]  Age: 63 y.o. MRN: [id number]  Chief Complaint  Patient presents with   Medical Management of Chronic Issues    6 month follow up     HPI  Patient presents today for follow-up and to obtain labs and EKG for upcoming ankle surgery on 09/26/2023.  This is to be done by Dr. Nigel, with [facility] [place].  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 Exam conducted with a chaperone present (ex-wife Niels is with him today).  Constitutional:      Appearance: Normal appearance. He is obese.  HENT:     Head: Normocephalic.     Mouth/Throat:     Mouth: Mucous membranes are moist.     Pharynx: Oropharynx is clear.  Eyes:     Extraocular Movements: Extraocular movements intact.     Pupils: Pupils are equal, round, and reactive to light.  Cardiovascular:     Rate and Rhythm: Normal rate and regular rhythm.     Heart sounds: S1 normal and S2 normal. Murmur heard.     Systolic murmur is present with  a grade of 2/6.     Comments: ECHO from 05/2021 showed the aortic valve is tricuspid. There was mild calcification of the aortic valve.  There was mild thickening of the aortic valve.  Aortic valve regurgitation was not visualized.   Pulmonary:     Effort: Pulmonary effort is normal.     Breath sounds: Normal breath sounds.  Musculoskeletal:     Cervical back: Normal range of motion and neck supple.     Right lower leg: No edema.     Left lower leg: No edema.     Right ankle: Tenderness present. Decreased range of motion.     Left ankle: Tenderness present. Decreased range of motion.  Neurological:     Mental Status: He is alert and oriented to person, place, and time.     Gait: Gait abnormal (antalgic, walks with cane).  Psychiatric:        Mood and Affect: Mood normal.        Thought Content: Thought content normal.     Results for orders placed or performed in visit on 08/22/23  EKG 12-Lead  Result Value Ref Range   EKG 12 lead      Last CBC Lab Results  Component Value  Date   WBC 4.9 05/02/2023   HGB 13.1 05/02/2023   HCT 37.8 05/02/2023   MCV 97 05/02/2023   MCH 33.6 (H) 05/02/2023   RDW 14.5 05/02/2023   PLT 141 (L) 05/02/2023   Last metabolic panel Lab Results  Component Value Date   GLUCOSE 99 08/07/2023   NA 137 08/07/2023   K 3.8 08/07/2023   CL 103 08/07/2023   CO2 18 (L) 08/07/2023   BUN 12 08/07/2023   CREATININE 1.05 08/07/2023   EGFR 79 08/07/2023   CALCIUM 8.8 08/07/2023   PROT 6.7 05/02/2023   ALBUMIN  3.4 (L) 05/02/2023   LABGLOB 3.3 05/02/2023   BILITOT 2.8 (H) 05/02/2023   ALKPHOS 105 05/02/2023   AST 49 (H) 05/02/2023   ALT 34 05/02/2023   ANIONGAP 7 02/24/2023   Last hemoglobin A1c Lab Results  Component Value Date   HGBA1C 4.7 (L) 02/28/2023   Last thyroid functions Lab Results  Component Value Date   TSH 2.600 02/28/2023   Last vitamin D  Lab Results  Component Value Date   VD25OH 23.9 (L) 02/28/2023      The 10-year  ASCVD risk score (Arnett DK, et al., 2019) is: 11.2%    Assessment & Plan:   Problem List Items Addressed This Visit       Musculoskeletal and Integument   Post-traumatic arthritis of ankle, right   He has upcoming ankle surgery on 09/26/23 with Dr. Jacki Socks, [facility]opa[facility]        Other   Preop examination - Primary   - Unremarkable exam in office. -Will obtain CBC and CMP as requested by his surgeon, Dr. Socks.   - EKG performed in office today was negative for any ischemic changes or arrhythmias. -We will fax over these results once they are available.      Relevant Orders   EKG 12-Lead (Completed)   CMP14+EGFR   CBC with Differential/Platelet    Return in about 3 months (around 11/22/2023) for chronic follow-up with PCP.    Leita Longs, FNP

## 2023-08-23 LAB — CMP14+EGFR
ALT: 18 IU/L (ref 0–44)
AST: 34 IU/L (ref 0–40)
Albumin: 3.3 g/dL — ABNORMAL LOW (ref 3.9–4.9)
Alkaline Phosphatase: 118 IU/L (ref 44–121)
BUN/Creatinine Ratio: 12 (ref 10–24)
BUN: 11 mg/dL (ref 8–27)
Bilirubin Total: 2.7 mg/dL — ABNORMAL HIGH (ref 0.0–1.2)
CO2: 18 mmol/L — ABNORMAL LOW (ref 20–29)
Calcium: 8.9 mg/dL (ref 8.6–10.2)
Chloride: 105 mmol/L (ref 96–106)
Creatinine, Ser: 0.95 mg/dL (ref 0.76–1.27)
Globulin, Total: 3.1 g/dL (ref 1.5–4.5)
Glucose: 87 mg/dL (ref 70–99)
Potassium: 3.8 mmol/L (ref 3.5–5.2)
Sodium: 137 mmol/L (ref 134–144)
Total Protein: 6.4 g/dL (ref 6.0–8.5)
eGFR: 89 mL/min/1.73 (ref >59)

## 2023-08-23 LAB — CBC WITH DIFFERENTIAL/PLATELET
Basophils Absolute: 0 x10E3/uL (ref 0.0–0.2)
Basos: 0 %
EOS (ABSOLUTE): 0.1 x10E3/uL (ref 0.0–0.4)
Eos: 2 %
Hematocrit: 32.8 % — ABNORMAL LOW (ref 37.5–51.0)
Hemoglobin: 11 g/dL — ABNORMAL LOW (ref 13.0–17.7)
Immature Grans (Abs): 0 x10E3/uL (ref 0.0–0.1)
Immature Granulocytes: 0 %
Lymphocytes Absolute: 1.4 x10E3/uL (ref 0.7–3.1)
Lymphs: 29 %
MCH: 32.4 pg (ref 26.6–33.0)
MCHC: 33.5 g/dL (ref 31.5–35.7)
MCV: 97 fL (ref 79–97)
Monocytes Absolute: 0.5 x10E3/uL (ref 0.1–0.9)
Monocytes: 10 %
Neutrophils Absolute: 2.8 x10E3/uL (ref 1.4–7.0)
Neutrophils: 59 %
Platelets: 128 x10E3/uL — ABNORMAL LOW (ref 150–450)
RBC: 3.4 x10E6/uL — ABNORMAL LOW (ref 4.14–5.80)
RDW: 13.6 % (ref 11.6–15.4)
WBC: 4.7 x10E3/uL (ref 3.4–10.8)

## 2023-08-28 ENCOUNTER — Ambulatory Visit (INDEPENDENT_AMBULATORY_CARE_PROVIDER_SITE_OTHER): Payer: PRIVATE HEALTH INSURANCE | Admitting: Gastroenterology

## 2023-08-28 ENCOUNTER — Telehealth: Payer: Self-pay

## 2023-08-28 ENCOUNTER — Encounter: Payer: Self-pay | Admitting: Gastroenterology

## 2023-08-28 ENCOUNTER — Ambulatory Visit: Payer: Self-pay | Admitting: Gastroenterology

## 2023-08-28 ENCOUNTER — Ambulatory Visit: Payer: No Typology Code available for payment source | Admitting: Internal Medicine

## 2023-08-28 VITALS — BP 109/69 | HR 61 | Temp 98.6°F | Ht 69.0 in | Wt 224.6 lb

## 2023-08-28 DIAGNOSIS — K5900 Constipation, unspecified: Secondary | ICD-10-CM

## 2023-08-28 DIAGNOSIS — K7030 Alcoholic cirrhosis of liver without ascites: Secondary | ICD-10-CM | POA: Diagnosis not present

## 2023-08-28 DIAGNOSIS — K259 Gastric ulcer, unspecified as acute or chronic, without hemorrhage or perforation: Secondary | ICD-10-CM | POA: Diagnosis not present

## 2023-08-28 DIAGNOSIS — B9681 Helicobacter pylori [H. pylori] as the cause of diseases classified elsewhere: Secondary | ICD-10-CM | POA: Insufficient documentation

## 2023-08-28 DIAGNOSIS — K7031 Alcoholic cirrhosis of liver with ascites: Secondary | ICD-10-CM

## 2023-08-28 DIAGNOSIS — F1091 Alcohol use, unspecified, in remission: Secondary | ICD-10-CM | POA: Diagnosis not present

## 2023-08-28 LAB — CBC WITH DIFFERENTIAL/PLATELET
Basophils Absolute: 0 x10E3/uL (ref 0.0–0.2)
Basos: 0 %
EOS (ABSOLUTE): 0.1 x10E3/uL (ref 0.0–0.4)
Eos: 1 %
Hematocrit: 34.4 % — ABNORMAL LOW (ref 37.5–51.0)
Hemoglobin: 11.3 g/dL — ABNORMAL LOW (ref 13.0–17.7)
Immature Grans (Abs): 0 x10E3/uL (ref 0.0–0.1)
Immature Granulocytes: 0 %
Lymphocytes Absolute: 1.3 x10E3/uL (ref 0.7–3.1)
Lymphs: 29 %
MCH: 32.7 pg (ref 26.6–33.0)
MCHC: 32.8 g/dL (ref 31.5–35.7)
MCV: 99 fL — ABNORMAL HIGH (ref 79–97)
Monocytes Absolute: 0.5 x10E3/uL (ref 0.1–0.9)
Monocytes: 10 %
Neutrophils Absolute: 2.7 x10E3/uL (ref 1.4–7.0)
Neutrophils: 60 %
Platelets: 125 x10E3/uL — ABNORMAL LOW (ref 150–450)
RBC: 3.46 x10E6/uL — ABNORMAL LOW (ref 4.14–5.80)
RDW: 14 % (ref 11.6–15.4)
WBC: 4.6 x10E3/uL (ref 3.4–10.8)

## 2023-08-28 LAB — PROTIME-INR
INR: 1.3 — ABNORMAL HIGH (ref 0.9–1.2)
Prothrombin Time: 14.1 s — ABNORMAL HIGH (ref 9.1–12.0)

## 2023-08-28 LAB — COMPREHENSIVE METABOLIC PANEL WITH GFR
ALT: 19 IU/L (ref 0–44)
AST: 33 IU/L (ref 0–40)
Albumin: 3.2 g/dL — ABNORMAL LOW (ref 3.9–4.9)
Alkaline Phosphatase: 114 IU/L (ref 44–121)
BUN/Creatinine Ratio: 11 (ref 10–24)
BUN: 11 mg/dL (ref 8–27)
Bilirubin Total: 2.6 mg/dL — ABNORMAL HIGH (ref 0.0–1.2)
CO2: 17 mmol/L — ABNORMAL LOW (ref 20–29)
Calcium: 8.8 mg/dL (ref 8.6–10.2)
Chloride: 104 mmol/L (ref 96–106)
Creatinine, Ser: 0.97 mg/dL (ref 0.76–1.27)
Globulin, Total: 3.1 g/dL (ref 1.5–4.5)
Glucose: 86 mg/dL (ref 70–99)
Potassium: 3.8 mmol/L (ref 3.5–5.2)
Sodium: 138 mmol/L (ref 134–144)
Total Protein: 6.3 g/dL (ref 6.0–8.5)
eGFR: 87 mL/min/1.73 (ref >59)

## 2023-08-28 LAB — IRON,TIBC AND FERRITIN PANEL
Ferritin: 135 ng/mL (ref 30–400)
Iron Saturation: 49 % (ref 15–55)
Iron: 155 ug/dL (ref 38–169)
Total Iron Binding Capacity: 315 ug/dL (ref 250–450)
UIBC: 160 ug/dL (ref 111–343)

## 2023-08-28 LAB — IGG, IGA, IGM
IgA/Immunoglobulin A, Serum: 649 mg/dL — ABNORMAL HIGH (ref 61–437)
IgG (Immunoglobin G), Serum: 1578 mg/dL (ref 603–1613)
IgM (Immunoglobulin M), Srm: 203 mg/dL — ABNORMAL HIGH (ref 20–172)

## 2023-08-28 LAB — HEMOCHROMATOSIS DNA-PCR(C282Y,H63D)

## 2023-08-28 LAB — AFP TUMOR MARKER: AFP, Serum, Tumor Marker: 7.4 ng/mL (ref 0.0–8.4)

## 2023-08-28 NOTE — Progress Notes (Addendum)
 GI Office Note    Referring Provider: Melvenia Manus BRAVO, MD Primary Care Physician:  Bevely Doffing, FNP  Primary Gastroenterologist: Ozell Hollingshead, MD   Chief Complaint   Chief Complaint  Patient presents with   Follow-up    Foloow up on alcoholic cirrhosis of liver, elevated AFP    History of Present Illness   Billy Chung is a 63 y.o. male presenting today for follow up. Last seen 04/2023. H/o recently decompensated etoh related cirrhosis manifested as anasarca/ascites. He has required multiple large volume paracenteses since 11/2022. Quit drinking etoh in 11/2022, went through DTs. Large quantities of beer over the years, (5 DWIs). No etoh since 11/2022. Fortunately his need for LVAPs slowed and last one in 02/2023.   Immunoglobulins elevated as was smooth muscle antibody. ANA and AMA negative.  Iron studies elevated felt to be acute phase reactant related to alcohol. All taps have been negative for SBP. Advised to get Hep B vaccine.    Completed EGD as outlined below. Treated with Pylera for H.pylori gastritis.    Evaluated by IR for possible TIPS few months back but given improved control of ascites and elevated MELD, he was not a candidate for TIPS.   No prior colonoscopy, declined at last ov due to ankle issues.  MRI liver completed in April 2025 for elevated AFP. No evidence of hepatoma.   Today: Ankle surgery 09/26/23. Weight is up 25 pounds since 04/2023.  Notes his weight is up and wants to be checked for recurrent ascites. At home he has notices 8 pound weight loss in past one month. States his boots and stuff in his pockets weigh 12 pounds. He notes that he was down at 200 pounds and felt good, with little edema. He believes his weight gain is due to excessive salt. His sister has been preparing his meals and cooks with too much salt. He states he can eat 2-3 of her meals and gain five pounds. She has been helping him due to his ankle pain. He is currently  ambulating with a walker. Has pain all the time. Awaiting ankle surgery next month, will need both completed. Notes that he had masked his pain for so long with etoh use and now that he is no longer drinking, he is having a lot of pain. He also notes that his last refill of furosemide  had different instructions, had him only taking one in am and not the 1/2 pill at night.   He notes his BMs are less frequent now on narcotics. Has stool 1-2 times per week. [place] 4. Takes occasional miralax but not often. Taking stool softener with laxative as needed. Works well. No melena, brbpr. Denies abdominal pain but does not abd distention without regular stools. No n/v.   States he is going to try and get disability.   Orthopedic surgeon, Dr. Jacki Socks, [facility], surgery at [facility]. Patient states his surgeon is fully aware of his cirrhosis.    Labs MELD 3.0: 14 on 03/09/23. 13 on 08/22/2023 US : MRCP 05/17/2023 due to AFP of 10.8  with no suspicious liver lesions.  AFP:  6.3 (11/2022)-->10.8 (04/2023)-->7.4 (08/2023) Hep A/B vaccination: immune to Hep A, will need Hep B  EGD:  03/2023, no varices. Noted portal htn gastropaty, gastric/duodenal ulcers with h.pylori on bx BB: no Ascites/peripheral edema: Yes, presented with decompensation, anasarca. Last paracentesis, 03/03/23 Diuretics: spironolactone  100mg  AM, 50mg  PM, furosemide  40mg  AM, 20mg  PM Paracentesis: yes, first on 12/07/22, has  standing order History of SBP: No Encephalopathy:  No    EGD 03/2023: -normal esophagus -portal hypertensive gastropathy -multiple gastric and duodenal ulcers s/p bx. H pylori on bx. -pantoprazole  40mg  BID before meals     Medications   Current Outpatient Medications  Medication Sig Dispense Refill   Cholecalciferol (VITAMIN D3) 50 MCG (2000 UT) capsule Take 2,000 Units by mouth daily.     furosemide  (LASIX ) 40 MG tablet Take 40mg  in am and 20mg  in pm 45 tablet 5   HYDROcodone -acetaminophen   (NORCO/VICODIN) 5-325 MG tablet One tablet every four hours for pain. 30 tablet 0   pantoprazole  (PROTONIX ) 40 MG tablet Take 1 tablet (40 mg total) by mouth 2 (two) times daily before a meal. 60 tablet 11   polyethylene glycol powder (GLYCOLAX/MIRALAX) 17 GM/SCOOP powder Take 1 Container by mouth daily as needed.     spironolactone  (ALDACTONE ) 100 MG tablet Take 100mg  in am and 50mg  in pm 45 tablet 5   No current facility-administered medications for this visit.    Allergies   Allergies as of 08/28/2023 - Review Complete 08/28/2023  Allergen Reaction Noted   No known allergies  11/04/2015        Review of Systems   General: Negative for anorexia, weight loss, fever, chills, fatigue, weakness. ENT: Negative for hoarseness, difficulty swallowing , nasal congestion. CV: Negative for chest pain, angina, palpitations, dyspnea on exertion, peripheral edema.  Respiratory: Negative for dyspnea at rest, dyspnea on exertion, cough, sputum, wheezing.  GI: See history of present illness. GU:  Negative for dysuria, hematuria, urinary incontinence, urinary frequency, nocturnal urination.  Endo: Negative for unusual weight change.     Physical Exam   BP 109/69   Pulse 61   Temp 98.6 F (37 C)   Ht 5' 9 (1.753 m)   Wt 224 lb 9.6 oz (101.9 kg) Comment: pt states without clothes on this am he weighed 212lbs  BMI 33.17 kg/m    General: Well-nourished, well-developed in no acute distress.  Eyes: No icterus. Mouth: Oropharyngeal mucosa moist and pink   Abdomen: Bowel sounds are normal, nontender, nondistended, no hepatosplenomegaly or masses,  no abdominal bruits or hernia , no rebound or guarding.  Rectal: not performed Extremities:1-2+ bilateral lower extremity edema. No clubbing or deformities. Neuro: Alert and oriented x 4   Skin: Warm and dry, no jaundice.   Psych: Alert and cooperative, normal mood and affect.  Labs   Lab Results  Component Value Date   WBC 4.6 08/22/2023    WBC 4.7 08/22/2023   HGB 11.3 (L) 08/22/2023   HGB 11.0 (L) 08/22/2023   HCT 34.4 (L) 08/22/2023   HCT 32.8 (L) 08/22/2023   MCV 99 (H) 08/22/2023   MCV 97 08/22/2023   PLT 125 (L) 08/22/2023   PLT 128 (L) 08/22/2023   Lab Results  Component Value Date   NA 138 08/22/2023   NA 137 08/22/2023   CL 104 08/22/2023   CL 105 08/22/2023   K 3.8 08/22/2023   K 3.8 08/22/2023   CO2 17 (L) 08/22/2023   CO2 18 (L) 08/22/2023   BUN 11 08/22/2023   BUN 11 08/22/2023   CREATININE 0.97 08/22/2023   CREATININE 0.95 08/22/2023   EGFR 87 08/22/2023   EGFR 89 08/22/2023   CALCIUM 8.8 08/22/2023   CALCIUM 8.9 08/22/2023   ALBUMIN  3.2 (L) 08/22/2023   ALBUMIN  3.3 (L) 08/22/2023   GLUCOSE 86 08/22/2023   GLUCOSE 87 08/22/2023   Lab Results  Component Value Date   IRON 155 08/22/2023   TIBC 315 08/22/2023   FERRITIN 135 08/22/2023   Lab Results  Component Value Date   INR 1.3 (H) 08/22/2023   INR 1.2 05/02/2023   INR 1.3 (H) 03/09/2023   Lab Results  Component Value Date   ALT 19 08/22/2023   ALT 18 08/22/2023   AST 33 08/22/2023   AST 34 08/22/2023   ALKPHOS 114 08/22/2023   ALKPHOS 118 08/22/2023   BILITOT 2.6 (H) 08/22/2023   BILITOT 2.7 (H) 08/22/2023   Lab Results  Component Value Date   INR 1.3 (H) 08/22/2023   INR 1.2 05/02/2023   INR 1.3 (H) 03/09/2023     08/22/23: AFP 7.4, IgG 1578, IgA 649H, IgM 203H  Imaging Studies   No results found.  Assessment/Plan:   Etoh cirrhosis with history of ascites/anasarca at diagnosis: -no etoh since 11/21/2022 -MELD 3.0 of 21 (11/2022)-->17 (12/2022)-->14 (02/2023)-->13 (04/2023)-->13 (08/2023) -frequent LVAPs 11/2022-02/2023 but has not required additional taps and did not require TIPS. He requested u/s to check for ascites today but abdominal exam without significant ascites present -his IgG/IgM/IgA, ferritin have all improved since etoh cessation. Ferritin normalized. Will recheck IgG/IgM/IgA and smooth muscle ab with next  labs. If persistent elevation of immunoglobulins then consider SPEP/kappa and lambda light chains -he does have some mild LE edema in setting of change in diuretics and increased sodium intake. Discussed 2000mg  sodium restriction daily. He has sister preparing food due to his ankle pain. Encouraged him to watch his sodium intake. He is at risk of worsening edema/ascites   -we will check with pharmacy to determine why instructions different on his lasix  rx -history of mildly elevated AFP, now normal, due for hepatoma surveillance by u/s in 11/2023, arrange after next ov -needs to complete Hep B vaccination when agreeable -return ov in 3 months.  Constipation: miralax BID was too strong, therefore he stopped taking. Using prn laxatives. Now on opioids for ankle pain, likely etiology -try miralax one capful every other day.  -we can titrate as needed  Colon cancer screening: on hold until ankle surgeries complete per patient request  Gastric/duodenal ulcers: in setting of H.pylori s/p Pylera -continue pantoprazole  40mg  BID for now, may reduce to once daily dosing after his surgeries are complete -will need to check for H.pylori eradication in the future -consider EGD to verify gastric ulcer healing once he is agreeable, desires postponing until he has completed his ankle surgeries as his pain is debilitating.   Cc: Dr. Jacki Nigel Sonny Chung. Ezzard, MHS, PA-C [address] [facility]

## 2023-08-28 NOTE — Telephone Encounter (Signed)
 Can you get me a copy of med list from pharmacy? My RX form 04/2023 was for 40mg  AM and 20mg  PM. That is also what you checked on intake today. I am trying to find out where the change occurred.

## 2023-08-28 NOTE — Patient Instructions (Signed)
 We will call [facility] to determine how the furosemide  dose was changes. I would like for you to add back 20mg  in evening since you are having swelling/fluid retention. You need to be more careful with sodium intake, not to exceed more than 2000mg  daily. This is likely contributing to your recent fluid retention and weight gain.  Continue to weigh yourself at home. If you gain more than 5 pounds, please let me know. You will be due for liver ultrasound in 11/2023, we will remind you. You may need repeat upper endoscopy to make sure your ulcers healed but we will hold off given upcoming surgery. Continue pantoprazole  twice daily before breakfast and evening meal. Restart miralax taking one capful every other day. If you find that your stools are still infrequent, you can increase to every day.  Call or reach out if you have any questions or concerns.

## 2023-08-28 NOTE — Telephone Encounter (Signed)
 Phoned to [facility] spoke with pharmacy  the dosage of pt's Furosemide  is 40 mg. Directions: take half of a pill in the am and pm. ( These new directions were placed in March). Pt last picked up a 90 day supply June 15th.

## 2023-08-29 MED ORDER — FUROSEMIDE 40 MG PO TABS: ORAL_TABLET | ORAL | 5 refills | Status: DC

## 2023-08-29 NOTE — Telephone Encounter (Signed)
 The last furosemide  filled was a different prescription number. I think they pulled an old RX that still had refills and filled that one.   Currently patient is taking furosemide  40mg  am and not taking 20mg  in pm. He has significant lower extremity edema could be due to change in his furosemide  dosage and his increased salt intake.   I would encourage him to take furosemide  40mg  in am and add back 20mg  in pm. I can send in new RX, he should tell pharmacy that he needs the newest one filled.   Let's have him get another BMET and CBC in one week after he increases his dosage to make sure he is tolerating. Dx: cirrhosis/anemia

## 2023-08-29 NOTE — Addendum Note (Signed)
 Addended by: EZZARD SONNY RAMAN on: 08/29/2023 01:52 PM   Modules accepted: Orders

## 2023-08-29 NOTE — Telephone Encounter (Signed)
 Phoned to [facility] and the pt's medication list is being faxed. I will place on your desk when it arrives.

## 2023-08-30 ENCOUNTER — Ambulatory Visit: Payer: Self-pay

## 2023-08-30 NOTE — Telephone Encounter (Signed)
 Phoned the pt, no answer no vm

## 2023-08-31 NOTE — Telephone Encounter (Signed)
Phoned the pt LMOVM for the pt to return call

## 2023-09-04 ENCOUNTER — Other Ambulatory Visit: Payer: Self-pay

## 2023-09-04 DIAGNOSIS — K7460 Unspecified cirrhosis of liver: Secondary | ICD-10-CM

## 2023-09-04 DIAGNOSIS — D649 Anemia, unspecified: Secondary | ICD-10-CM

## 2023-09-04 NOTE — Telephone Encounter (Signed)
 FYI:  Phoned and advised the pt of the note and new Rx that will be sent in and to have the pharmacy refill that one. Explained instructions to this medication. Pt also advised of labs being drawn this week. Pt expressed understanding.  Labs put in and released. Pt (preference was [facility])

## 2023-09-04 NOTE — Telephone Encounter (Signed)
 Noted. Thanks.

## 2023-09-07 IMAGING — US US ABDOMEN LIMITED
1 series · 14 of 25 positions shown · non-contrast
Comparison: None.

CLINICAL DATA: Alcohol dependence.

EXAM:
ULTRASOUND ABDOMEN LIMITED RIGHT UPPER QUADRANT

[Series 1: us abdomen limited ruq (liver/gb) · 14 of 120 slices shown]
[im 1/120]
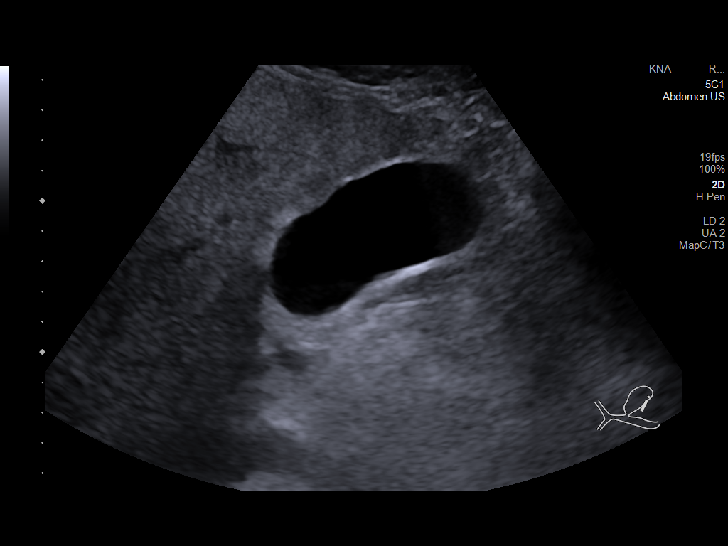
[im 10/120]
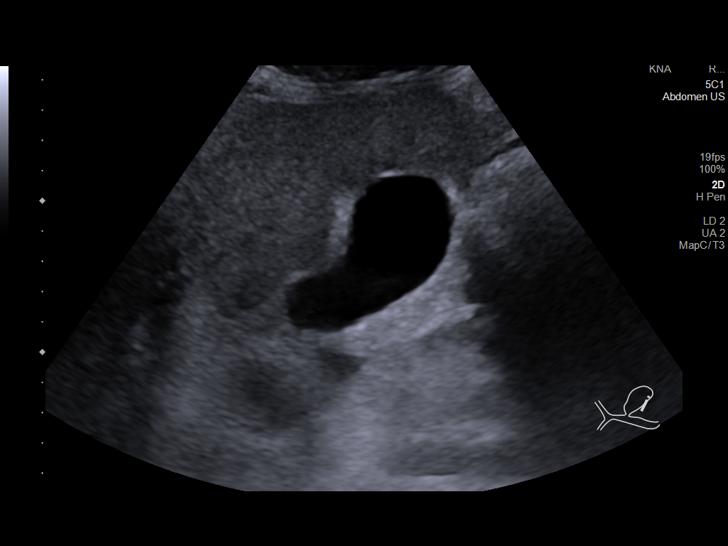
[im 20/120]
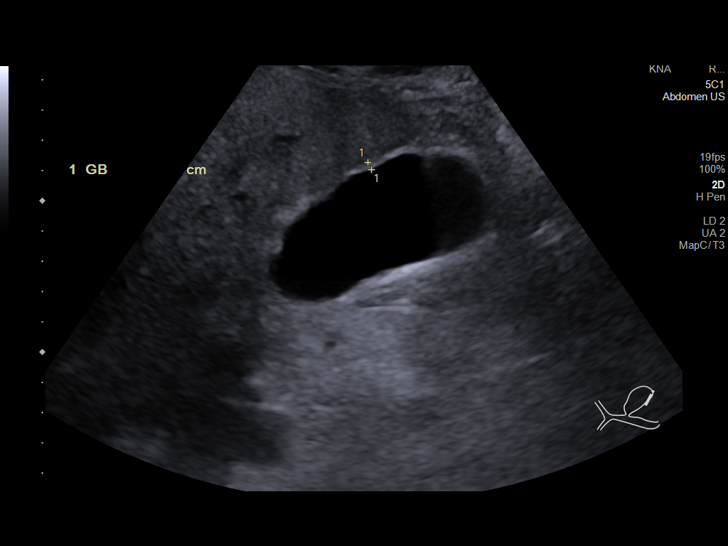
[im 30/120]
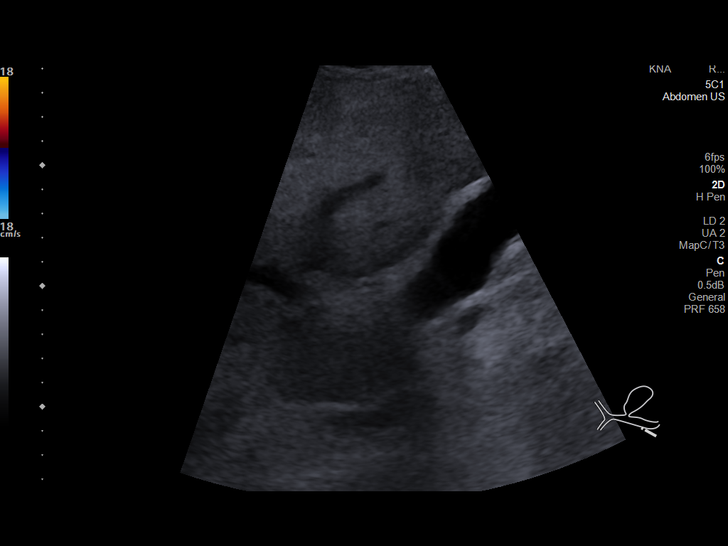
[im 40/120]
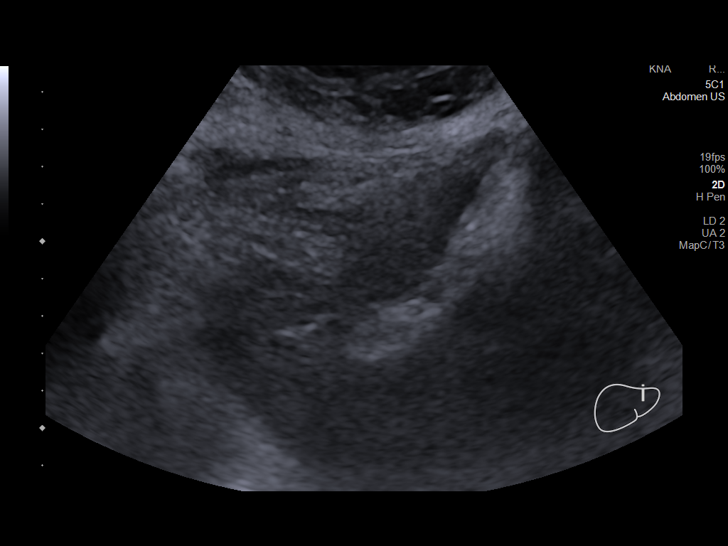
[im 45/120]
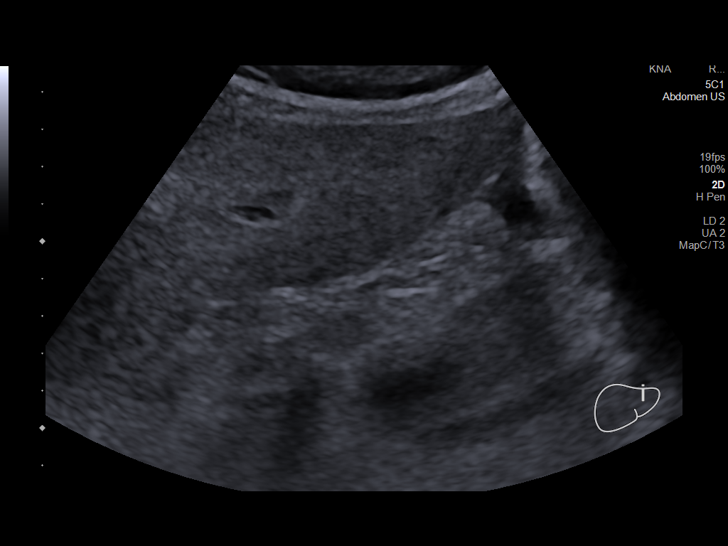
[im 55/120]
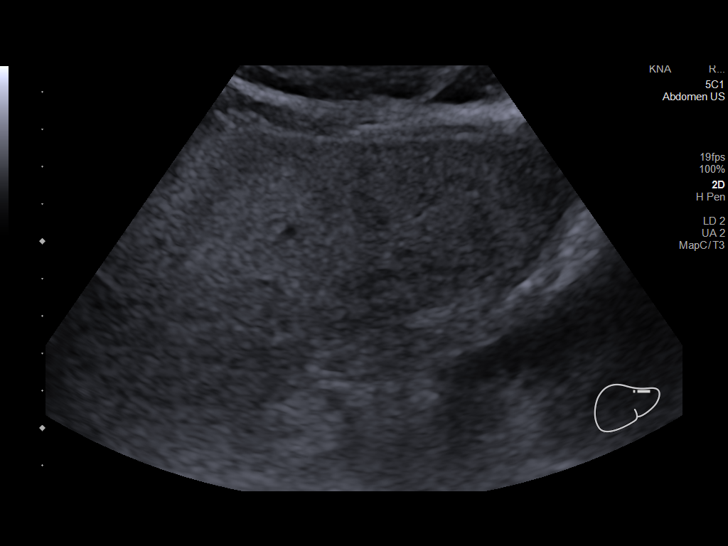
[im 65/120]
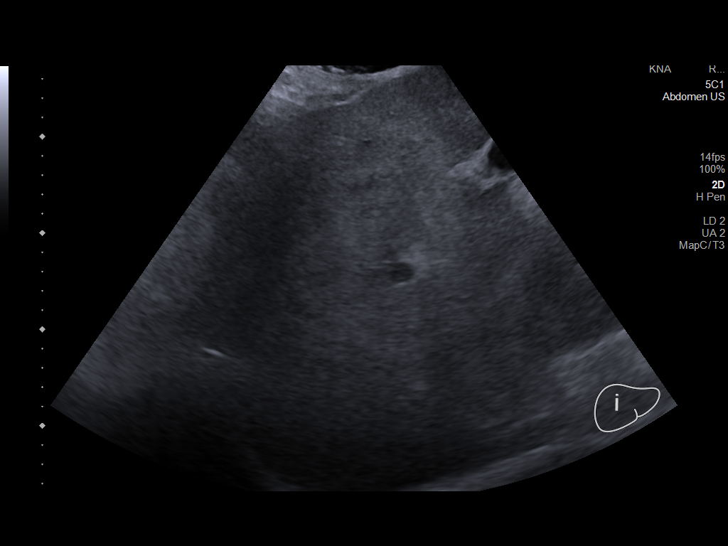
[im 75/120]
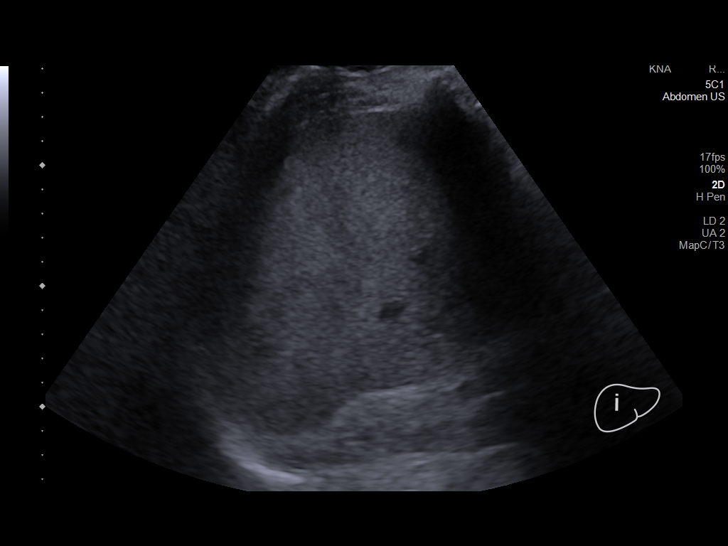
[im 80/120]
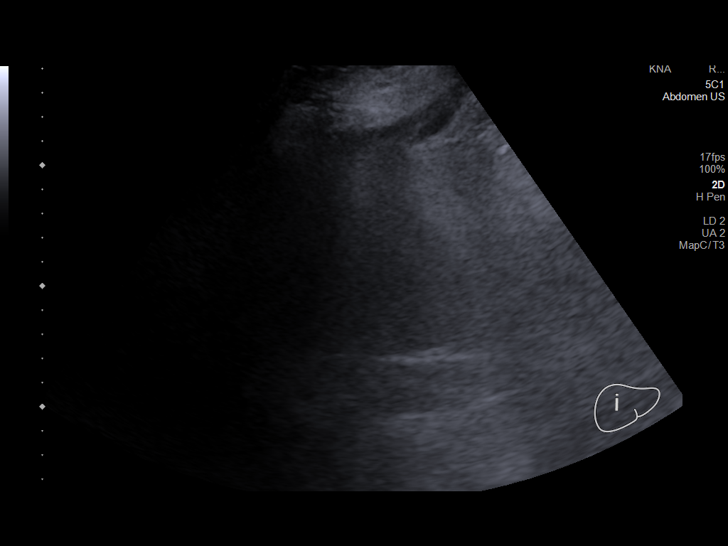
[im 90/120]
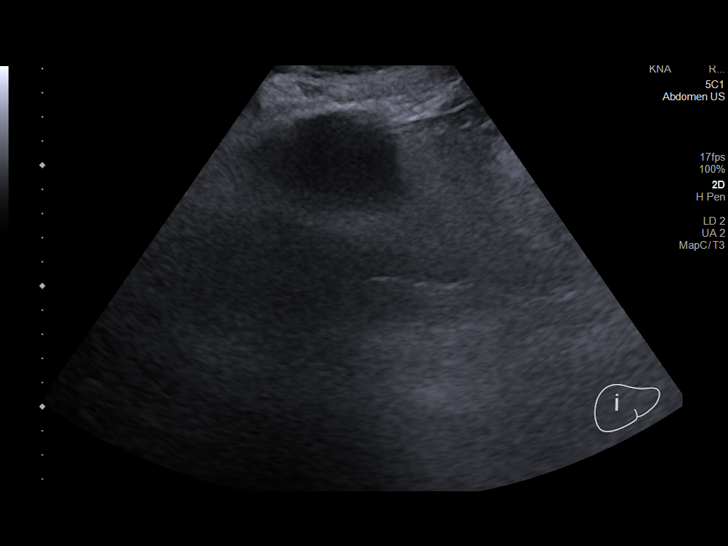
[im 100/120]
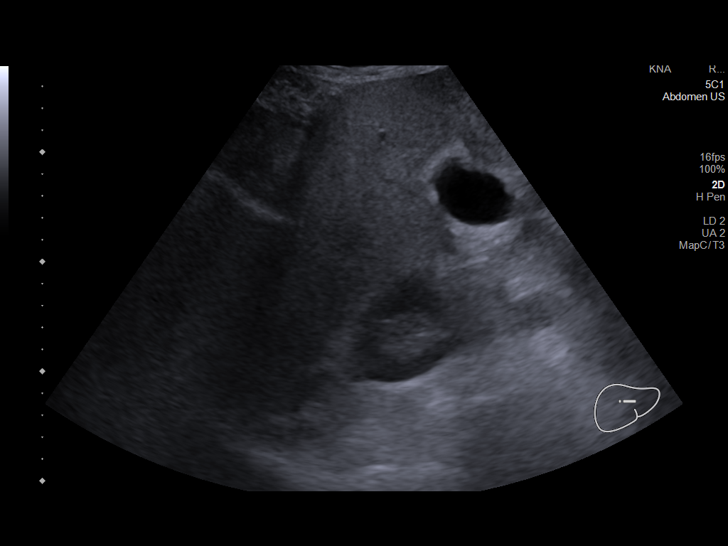
[im 110/120]
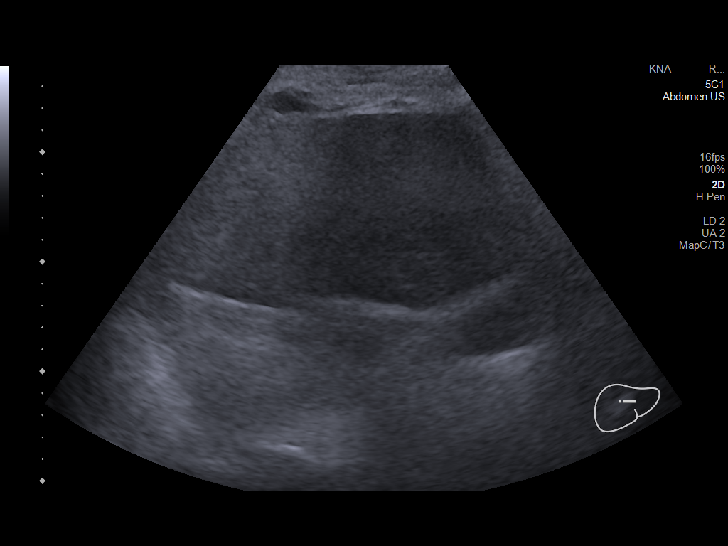
[im 120/120]
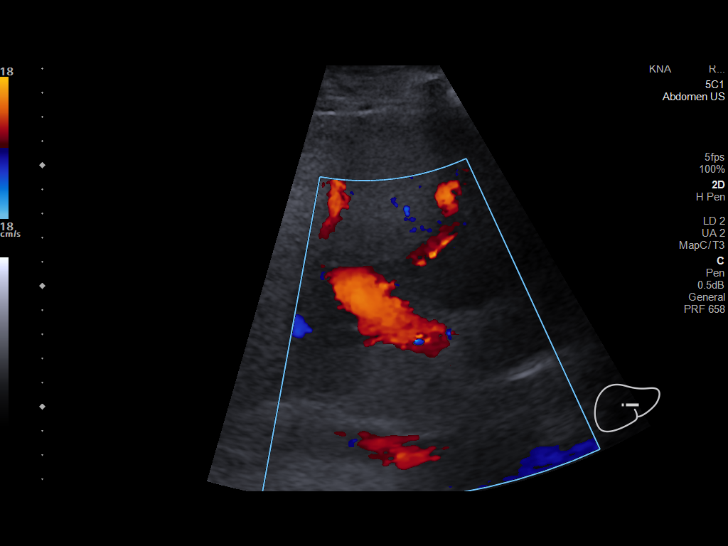

[14 of 25 positions shown; findings below may reference images not displayed]

FINDINGS: Gallbladder:

No gallstones or wall thickening visualized. No sonographic Murphy
sign noted by sonographer.

Common bile duct:

Diameter: 3 mm

Liver:

There is diffuse increased liver echogenicity most commonly seen in
the setting of fatty infiltration. There is however mild surface
irregularity suggestive of cirrhosis. Portal vein is patent on color
Doppler imaging with normal direction of blood flow towards the
liver.

Other: None.
IMPRESSION: Fatty liver with findings suggestive of cirrhosis. Clinical
correlation is recommended.

## 2023-09-08 LAB — CBC WITH DIFFERENTIAL/PLATELET
Basophils Absolute: 0 x10E3/uL (ref 0.0–0.2)
Basos: 0 %
EOS (ABSOLUTE): 0.1 x10E3/uL (ref 0.0–0.4)
Eos: 2 %
Hematocrit: 33.3 % — ABNORMAL LOW (ref 37.5–51.0)
Hemoglobin: 11.1 g/dL — ABNORMAL LOW (ref 13.0–17.7)
Immature Grans (Abs): 0 x10E3/uL (ref 0.0–0.1)
Immature Granulocytes: 0 %
Lymphocytes Absolute: 1.4 x10E3/uL (ref 0.7–3.1)
Lymphs: 30 %
MCH: 32.5 pg (ref 26.6–33.0)
MCHC: 33.3 g/dL (ref 31.5–35.7)
MCV: 97 fL (ref 79–97)
Monocytes Absolute: 0.5 x10E3/uL (ref 0.1–0.9)
Monocytes: 11 %
Neutrophils Absolute: 2.7 x10E3/uL (ref 1.4–7.0)
Neutrophils: 57 %
Platelets: 133 x10E3/uL — ABNORMAL LOW (ref 150–450)
RBC: 3.42 x10E6/uL — ABNORMAL LOW (ref 4.14–5.80)
RDW: 14.1 % (ref 11.6–15.4)
WBC: 4.7 x10E3/uL (ref 3.4–10.8)

## 2023-09-08 LAB — BASIC METABOLIC PANEL WITH GFR
BUN/Creatinine Ratio: 18 (ref 10–24)
BUN: 17 mg/dL (ref 8–27)
CO2: 18 mmol/L — ABNORMAL LOW (ref 20–29)
Calcium: 9 mg/dL (ref 8.6–10.2)
Chloride: 105 mmol/L (ref 96–106)
Creatinine, Ser: 0.97 mg/dL (ref 0.76–1.27)
Glucose: 87 mg/dL (ref 70–99)
Potassium: 4.4 mmol/L (ref 3.5–5.2)
Sodium: 137 mmol/L (ref 134–144)
eGFR: 87 mL/min/1.73 (ref >59)

## 2023-09-09 ENCOUNTER — Ambulatory Visit: Payer: Self-pay | Admitting: Gastroenterology

## 2023-09-12 ENCOUNTER — Encounter: Payer: Self-pay | Admitting: Gastroenterology

## 2023-09-12 ENCOUNTER — Telehealth: Payer: Self-pay | Admitting: Gastroenterology

## 2023-09-12 NOTE — Telephone Encounter (Signed)
 I have type up a letter for patient to pick up regarding his cirrhosis and disability application. Please print and have ready for patient on 09/13/23 AM.

## 2023-09-13 NOTE — Telephone Encounter (Signed)
 Letter has been placed up front for pickup. MyChart message was sent to pt letting him know.

## 2023-10-06 ENCOUNTER — Encounter: Payer: Self-pay | Admitting: Radiology

## 2023-10-18 ENCOUNTER — Encounter (HOSPITAL_COMMUNITY): Payer: Self-pay

## 2023-10-18 ENCOUNTER — Ambulatory Visit (HOSPITAL_COMMUNITY)
Admission: RE | Admit: 2023-10-18 | Discharge: 2023-10-18 | Disposition: A | Discharge status: Home / Self Care | Source: Ambulatory Visit | Attending: Gastroenterology | Admitting: Gastroenterology

## 2023-10-18 DIAGNOSIS — R188 Other ascites: Secondary | ICD-10-CM | POA: Insufficient documentation

## 2023-10-18 LAB — BODY FLUID CELL COUNT WITH DIFFERENTIAL
Eos, Fluid: 0 %
Lymphs, Fluid: 54 %
Monocyte-Macrophage-Serous Fluid: 25 % — ABNORMAL LOW (ref 50–90)
Neutrophil Count, Fluid: 21 % (ref 0–25)
Total Nucleated Cell Count, Fluid: 166 uL (ref 0–1000)

## 2023-10-18 NOTE — Progress Notes (Signed)
 Patient tolerated right sided Paracentesis procedure well today and 4.5 Liters of yellow ascites removed with labs collected and sent to lab for processing. Patient verbalized understanding of discharge instructions and ambulatory at departure with no acute distress noted.

## 2023-10-18 NOTE — Procedures (Signed)
Ultrasound-guided diagnostic and therapeutic paracentesis performed yielding 4.5 liters of straw colored fluid.  Fluid was sent to lab for analysis. No immediate complications. EBL is none.  

## 2023-10-19 ENCOUNTER — Inpatient Hospital Stay (HOSPITAL_COMMUNITY): Admission: RE | Admit: 2023-10-19 | Payer: PRIVATE HEALTH INSURANCE | Source: Ambulatory Visit

## 2023-10-19 ENCOUNTER — Ambulatory Visit: Payer: Self-pay | Admitting: Gastroenterology

## 2023-10-19 LAB — PATHOLOGIST SMEAR REVIEW

## 2023-10-24 ENCOUNTER — Other Ambulatory Visit: Payer: Self-pay | Admitting: Gastroenterology

## 2023-10-24 MED ORDER — LACTULOSE 10 GM/15ML PO SOLN: 20.0000 g | Freq: Three times a day (TID) | ORAL | 1 refills | Status: AC | PRN

## 2023-10-24 NOTE — Progress Notes (Signed)
 Billy Chung from [facility] reached out regarding constipation in setting of pain meds after recent surgery. Will start him on lactulose . Rx sent to pharmacy.

## 2023-11-02 ENCOUNTER — Encounter: Payer: Self-pay | Admitting: Gastroenterology

## 2023-11-23 ENCOUNTER — Ambulatory Visit: Payer: PRIVATE HEALTH INSURANCE

## 2023-11-23 VITALS — BP 108/65 | HR 63 | Ht 69.0 in | Wt 209.0 lb

## 2023-11-23 DIAGNOSIS — N62 Hypertrophy of breast: Secondary | ICD-10-CM

## 2023-11-23 NOTE — Progress Notes (Signed)
 Established Patient Office Visit  Subjective   Patient ID: Billy Chung, male    DOB: 1960/04/24  Age: 63 y.o. MRN: [id number]  Chief Complaint  Patient presents with   Medical Management of Chronic Issues    Pt here for 3 month follow up    HPI Discussed the use of AI scribe software for clinical note transcription with the patient, who gave verbal consent to proceed.  History of Present Illness    Billy Chung is a 63 year old male who presents for follow-up regarding his cast and concerns about breast tenderness and lymph node swelling.  Breast tenderness and swelling - Intermittent tenderness and swelling in the breasts, particularly around the nipple area - Area sometimes hard and tender, with symptoms that fluctuate - No known prior ultrasound evaluation of the area  Cervical lymphadenopathy - Intermittent swelling of a lymph node in the neck, described as a small, cyst-like spot - Swelling is not consistently present and was initially noticed by a friend      Patient Active Problem List   Diagnosis Date Noted   Multiple gastric ulcers 08/28/2023   H pylori ulcer 08/28/2023   Preop examination 08/22/2023   Arthritis of ankle joint 07/06/2023   Chronic ankle pain 07/06/2023   Tenosynovitis of peroneus brevis tendon 07/06/2023   Dysuria 02/28/2023   ETOH abuse 12/07/2022   Cirrhosis (HCC) 12/07/2022   Anasarca 12/07/2022   Constipation 12/07/2022   Post-traumatic arthritis of ankle, 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  Constitutional:      Appearance: Normal appearance. He is obese.  HENT:     Head: Normocephalic.  Eyes:     Extraocular Movements: Extraocular movements intact.     Pupils: Pupils are equal, round, and reactive to light.  Cardiovascular:     Rate and Rhythm: Normal rate and regular rhythm.  Pulmonary:     Effort: Pulmonary effort is normal.     Breath sounds: Normal breath sounds.  Musculoskeletal:     Cervical back: Normal range of motion and neck supple.  Lymphadenopathy:     Cervical: Cervical adenopathy present.     Right cervical: No superficial, deep or posterior cervical adenopathy.    Left cervical: Superficial cervical adenopathy present. No deep or posterior cervical adenopathy.     Upper Body:     Left upper body: No supraclavicular adenopathy.  Neurological:     Mental Status: He is alert and oriented to person, place, and time.  Psychiatric:        Mood and Affect: Mood normal.        Thought Content: Thought content normal.     Last CBC Lab Results  Component Value Date   WBC 4.7 09/07/2023   HGB 11.1 (L) 09/07/2023   HCT 33.3 (L) 09/07/2023  MCV 97 09/07/2023   MCH 32.5 09/07/2023   RDW 14.1 09/07/2023   PLT 133 (L) 09/07/2023   Last metabolic panel Lab Results  Component Value Date   GLUCOSE 116 (H) 11/23/2023   NA 136 11/23/2023   K 3.8 11/23/2023   CL 103 11/23/2023   CO2 20 11/23/2023   BUN 21 11/23/2023   CREATININE 1.03 11/23/2023   EGFR 81 11/23/2023   CALCIUM 8.9 11/23/2023   PROT 6.3 11/23/2023   ALBUMIN  3.2 (L) 11/23/2023   LABGLOB 3.1 11/23/2023   BILITOT 1.1 11/23/2023   ALKPHOS 118 11/23/2023   AST 30 11/23/2023   ALT 15 11/23/2023   ANIONGAP 7 02/24/2023   Last lipids Lab Results  Component Value Date   CHOL 151 02/28/2023   HDL 41 02/28/2023   LDLCALC 95 02/28/2023   TRIG 77 02/28/2023   CHOLHDL 3.7 02/28/2023   Last hemoglobin  A1c Lab Results  Component Value Date   HGBA1C 4.7 (L) 02/28/2023   Last thyroid functions Lab Results  Component Value Date   TSH 2.750 11/23/2023   Last vitamin D  Lab Results  Component Value Date   VD25OH 23.9 (L) 02/28/2023   Last vitamin B12 and Folate Lab Results  Component Value Date   VITAMINB12 1,441 (H) 02/28/2023   FOLATE 11.4 02/28/2023      The 10-year ASCVD risk score (Arnett DK, et al., 2019) is: 10.2%    Assessment & Plan:   Problem List Items Addressed This Visit   None Visit Diagnoses       Gynecomastia, male    -  Primary   Gynecomastia with intermittent breast tenderness Intermittent breast tenderness around the nipple. - Order blood work to check hormone levels.   Relevant Orders   TSH + free T4 (Completed)   CMP14+EGFR (Completed)   FSH/LH (Completed)   Estradiol (Completed)   Prolactin (Completed)       No follow-ups on file.    Leita Longs, FNP

## 2023-11-24 LAB — CMP14+EGFR
ALT: 15 IU/L (ref 0–44)
AST: 30 IU/L (ref 0–40)
Albumin: 3.2 g/dL — ABNORMAL LOW (ref 3.9–4.9)
Alkaline Phosphatase: 118 IU/L (ref 47–123)
BUN/Creatinine Ratio: 20 (ref 10–24)
BUN: 21 mg/dL (ref 8–27)
Bilirubin Total: 1.1 mg/dL (ref 0.0–1.2)
CO2: 20 mmol/L (ref 20–29)
Calcium: 8.9 mg/dL (ref 8.6–10.2)
Chloride: 103 mmol/L (ref 96–106)
Creatinine, Ser: 1.03 mg/dL (ref 0.76–1.27)
Globulin, Total: 3.1 g/dL (ref 1.5–4.5)
Glucose: 116 mg/dL — ABNORMAL HIGH (ref 70–99)
Potassium: 3.8 mmol/L (ref 3.5–5.2)
Sodium: 136 mmol/L (ref 134–144)
Total Protein: 6.3 g/dL (ref 6.0–8.5)
eGFR: 81 mL/min/1.73 (ref >59)

## 2023-11-24 LAB — FSH/LH
FSH: 7.1 m[IU]/mL (ref 1.5–12.4)
LH: 6.9 m[IU]/mL (ref 1.7–8.6)

## 2023-11-24 LAB — TSH+FREE T4
Free T4: 1.21 ng/dL (ref 0.82–1.77)
TSH: 2.75 u[IU]/mL (ref 0.450–4.500)

## 2023-11-24 LAB — ESTRADIOL: Estradiol: 43.5 pg/mL — ABNORMAL HIGH (ref 7.6–42.6)

## 2023-11-24 LAB — PROLACTIN: Prolactin: 21.1 ng/mL (ref 3.6–25.2)

## 2023-12-04 ENCOUNTER — Encounter: Payer: Self-pay | Admitting: Gastroenterology

## 2023-12-18 ENCOUNTER — Encounter: Payer: Self-pay | Admitting: Radiology

## 2023-12-21 ENCOUNTER — Other Ambulatory Visit: Payer: Self-pay

## 2023-12-25 ENCOUNTER — Ambulatory Visit (INDEPENDENT_AMBULATORY_CARE_PROVIDER_SITE_OTHER): Payer: PRIVATE HEALTH INSURANCE | Admitting: Gastroenterology

## 2023-12-25 VITALS — BP 112/68 | HR 62 | Temp 97.9°F | Ht 69.0 in | Wt 199.0 lb

## 2023-12-25 DIAGNOSIS — K259 Gastric ulcer, unspecified as acute or chronic, without hemorrhage or perforation: Secondary | ICD-10-CM | POA: Diagnosis not present

## 2023-12-25 DIAGNOSIS — K5900 Constipation, unspecified: Secondary | ICD-10-CM | POA: Diagnosis not present

## 2023-12-25 DIAGNOSIS — B9681 Helicobacter pylori [H. pylori] as the cause of diseases classified elsewhere: Secondary | ICD-10-CM

## 2023-12-25 DIAGNOSIS — K7031 Alcoholic cirrhosis of liver with ascites: Secondary | ICD-10-CM | POA: Diagnosis not present

## 2023-12-25 NOTE — Patient Instructions (Addendum)
 Continue furosemide  40mg  daily in the morning. Hold off on evening dose for now.  Continue spironolactone  100mg  daily in the morning. As soon as you get the new medication, eplerenone, you will stop spironolactone . Please let me know what pharmacy you need me to send RX to.  You will need to monitor for worsening swelling of the belly and legs. If worsens, we will adjust your fluid pills. Continue pantoprazole  twice daily before breakfast and supper for your stomach, given history of ulcers. Take lactulose  30mL (two tablespoons) 3-4 times daily until you have a soft stool. Then you can drop back to 2-3 times daily. YOU HAVE TO TAKE CONSISTENTLY. RECOMMEND YOU MEASURING THE DOSE TO MAKE SURE YOU ARE GETTING ENOUGH. You can use bisacodyl 10-15 mg for more immediate relief up to 1-2 times per week as needed.  IF YOUR CONSTIPATION IS NOT MANAGED THIS WAY, WE DO HAVE OTHER PRESCRIPTION OPTIONS. PLEASE LET ME KNOW. Once we have your labs back, we will make arrangements for liver imaging.  Nutrition: High protien diet (80 grams/day) from a primarily plant based diet. Avoid red meat. No raw or undercooked meat, seafood, or shellfish. Low fat/cholesterol/carbohydrate diet. Limit sodium to 2000 mg/day. Recommend a daily multivitamin.   Evening snack - high protein Supplements between meals to help meet calorie and protein goal: Boost Ensure Premier Protein Shakes Protein: Greek yogurt Fish, chicken (NO RAW OR UNDERCOOKED FISH/SHELLFISH) Avoid pork and red meat Plant based protein (non-soy)/Vegan: Lentils, Chickpeas, Peanuts (non salted), almonds (non salted), quinoa, chia seeds  Plant based protein supplements (not soy)  Avoid/limit animal based protein supplements: whey, casein Low sodium (2,000 mg/day) Avoid: table salt, canned foods, deli meats, sausages, hot dogs, anything with a long shelf life

## 2023-12-25 NOTE — Progress Notes (Signed)
 GI Office Note    Referring Provider: Bevely Doffing, FNP Primary Care Physician:  Bevely Doffing, FNP  Primary Gastroenterologist:Michael Shaaron, MD   Chief Complaint   Chief Complaint  Patient presents with   Follow-up    Follow up cirrhosis of liver    History of Present Illness   Billy Chung is a 63 y.o. male presenting today for follow up. Last seen 08/2023. H/o recently decompensated etoh related cirrhosis manifested as anasarca/ascites. He has required multiple large volume paracenteses since 11/2022. Quit drinking etoh in 11/2022, went through DTs. Large quantities of beer over the years, (5 DWIs). No etoh since 11/2022. Fortunately his need for LVAPs slowed and last one in 02/2023 until needing one in 10/2023 (post ankle surgery).  Immunoglobulins elevated as was smooth muscle antibody. ANA and AMA negative.  Iron studies elevated felt to be acute phase reactant related to alcohol. All taps have been negative for SBP. Advised to get Hep B vaccine.    Completed EGD as outlined below. Treated with Pylera for H.pylori gastritis.    Evaluated by IR for possible TIPS few months back but given improved control of ascites and elevated MELD, he was not a candidate for TIPS.    No prior colonoscopy, declined at last ov due to ankle issues.   MRI liver completed in April 2025 for elevated AFP. No evidence of hepatoma.      Discussed the use of AI scribe software for clinical note transcription with the patient, who gave verbal consent to proceed.  History of Present Illness   Billy Chung is a 63 year old male with cirrhosis who presents with constipation and fluid retention issues. He is accompanied by his ex-wife.  He has been experiencing significant constipation following his ankle surgery, which he attributes to hydrocodone  use. He went 18 days without a bowel movement despite using lactulose , enemas, suppositories. His bowel movements are described as 'dry and  hard,' causing bleeding and discomfort. He has not been taking lactulose  daily as prescribed. He typically would take a sip when walking by the fridge the first 1-2 weeks he was on it. Finally took Epson Salts and had large BM, losing 13 pounds per patient. He admits that he does not take his lactulose  every day. His stools are dry, bristol one, but larger balls. Can be painful to pass. Causes some bleeding.   He experiences frequent epistaxis, with two significant episodes in two weeks lasting 7-8 hours. Finally stopped after using afrin and tampons under the direction of this friend who is in the medical profession. He does not take blood thinners or ASA. But he did take some advil  more recently. States Aleve  causes his nose to bleed.  He reports fluid retention issues, particularly in his legs especially after his surgery in 10/2023. Admits he stopped his fluid pills for a week. He does not like taking night time dose of his diuretics, feels like he really needs to urinate but has difficult time with his urinary stream. He is back on his diuretics, including Lasix  and spironolactone , and lower extremity edema has improved. He has some swelling by the end of the day but better after laying down at night. He has experienced side effects such as breast tenderness, hard swelling under the nipple. Labs done by his PCP, waiting on results. Likely due to spironolactone .    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 He lives alone and  uses a wood stove for heating, believes this has contributed to his nosebleeds. He reports a decreased appetite and relies on protein supplements like Ensure for nutrition. Does not eat as much as he used to. No melena, no heartburn. No dysphagia. No confusion.   Wt Readings from Last 3 Encounters:  12/25/23 199 lb (90.3 kg)  11/23/23 209 lb (94.8 kg)  08/28/23 224 lb 9.6 oz (101.9 kg)   Prior Data   MELD 3.0: 14 on 03/09/23. 13 on 08/22/2023 US : [facility]  05/17/2023 due to AFP of 10.8  with no suspicious liver lesions.  AFP:  6.3 (11/2022)-->10.8 (04/2023)-->7.4 (08/2023) Hep A/B vaccination: immune to Hep A, will need Hep B  EGD:  03/2023, no varices. Noted portal htn gastropaty, gastric/duodenal ulcers with h.pylori on bx BB: no Ascites/peripheral edema: Yes, presented with decompensation, anasarca.Diuretics: spironolactone  100mg  AM, 50mg  PM, furosemide  40mg  AM, 20mg  PM Paracentesis: yes, first on 12/07/22. Last paracentesis, 03/03/23 but then required LVAP recently (10/2023) after ankle surgery.  History of SBP: No Encephalopathy:  No  EGD 03/2023: -normal esophagus -portal hypertensive gastropathy -multiple gastric and duodenal ulcers s/p bx. H pylori on bx. -pantoprazole  40mg  BID before meals  Medications   Current Outpatient Medications  Medication Sig Dispense Refill   furosemide  (LASIX ) 40 MG tablet Take 40mg  in am and 20mg  in pm 45 tablet 5   HYDROcodone -acetaminophen  (NORCO/VICODIN) 5-325 MG tablet One tablet every four hours for pain. 30 tablet 0   lactulose  (CHRONULAC ) 10 GM/15ML solution Take 30 mLs (20 g total) by mouth 3 (three) times daily as needed for mild constipation. 1800 mL 1   pantoprazole  (PROTONIX ) 40 MG tablet Take 1 tablet (40 mg total) by mouth 2 (two) times daily before a meal. 60 tablet 11   polyethylene glycol powder (GLYCOLAX/MIRALAX) 17 GM/SCOOP powder Take 1 Container by mouth daily as needed.     spironolactone  (ALDACTONE ) 100 MG tablet Take 100mg  in am and 50mg  in pm 45 tablet 5   No current facility-administered medications for this visit.    Allergies   Allergies as of 12/25/2023 - Review Complete 12/25/2023  Allergen Reaction Noted   No known allergies  11/04/2015     Review of Systems   General: Negative for   fever, chills, fatigue, weakness.see hpi ENT: Negative for hoarseness, difficulty swallowing , nasal congestion. CV: Negative for chest pain, angina, palpitations, dyspnea on exertion,+  peripheral edema.  Respiratory: Negative for dyspnea at rest, dyspnea on exertion, cough, sputum, wheezing.  GI: See history of present illness. GU:  Negative for dysuria, hematuria, urinary incontinence, urinary frequency, nocturnal urination. See hpi Endo: Negative for unusual weight change.     Physical Exam   BP 112/68   Pulse 62   Temp 97.9 F (36.6 C)   Ht 5' 9 (1.753 m)   Wt 199 lb (90.3 kg)   BMI 29.39 kg/m    General: chronically ill-appearing male accompanied by his ex wife. No acute distress.  Eyes: No icterus. Mouth: Oropharyngeal mucosa moist and pink   Lungs: Clear to auscultation bilaterally.  Heart: Regular rate and rhythm, no murmurs rubs or gallops.  Abdomen: Bowel sounds are normal, nontender, nondistended, no hepatosplenomegaly or masses,  no abdominal bruits or hernia , no rebound or guarding.  Rectal: not performed Extremities: trace bilateral lower extremity edema. Left boot on, I did removed to evaluate for edema.   Neuro: Alert and oriented x 4   Skin: Warm and dry, no jaundice.   Psych: Alert  and cooperative, normal mood and affect.  Labs   Lab Results  Component Value Date   NA 136 11/23/2023   CL 103 11/23/2023   K 3.8 11/23/2023   CO2 20 11/23/2023   BUN 21 11/23/2023   CREATININE 1.03 11/23/2023   EGFR 81 11/23/2023   CALCIUM 8.9 11/23/2023   ALBUMIN  3.2 (L) 11/23/2023   GLUCOSE 116 (H) 11/23/2023   Lab Results  Component Value Date   ALT 15 11/23/2023   AST 30 11/23/2023   ALKPHOS 118 11/23/2023   BILITOT 1.1 11/23/2023   Lab Results  Component Value Date   WBC 4.7 09/07/2023   HGB 11.1 (L) 09/07/2023   HCT 33.3 (L) 09/07/2023   MCV 97 09/07/2023   PLT 133 (L) 09/07/2023    Imaging Studies   No results found.  Assessment/Plan:    Etoh cirrhosis with history of ascites/anasarca at diagnosis: -no etoh since 11/21/2022 -MELD 3.0 of 21 (11/2022)-->17 (12/2022)-->14 (02/2023)-->13 (04/2023)-->13 (08/2023) -frequent LVAPs  11/2022-02/2023 but has not required additional taps up until recently. Patient reports LVAP in 09/2023 (I do not see documentation). He had LVAP post-operatively on 10/18/23.   -his IgG/IgM/IgA, ferritin have all improved since etoh cessation. Ferritin normalized. Will recheck IgG/IgM/IgA and smooth muscle ab with next labs. Not checking today, due to patient concerns for lab bill. If persistent elevation of immunoglobulins then consider SPEP/kappa and lambda light chains -he has minimal LE edema today. We will be adjusting his diuretics, dropping PM dose which he prefers not to take and switching spironolactone  to eplerenone due to gynecomastia concerns.  -update labs, given history of elevated AFP previously, we will hold off on imaging until AFP returns. May need MRI rather than U/S if abnormal.  -high protein diet, primarily plant based (80g/day). Avoid red meat. No raw or undercooked meat, seafood, or shellfish. Low fat/cholesterol/carbohydrate diet. Limit sodium to 2000 mg/day. Recommend a daily multivitamin. -needs to complete Hep B vaccination when agreeable -return ov in 3 months.  Constipation: miralax BID was too strong, therefore he stopped taking. Using prn laxatives. Now on opioids for ankle pain, likely etiology -lactulose  2 tbsp 3-4 times daily until soft stool, then decrease to 2-3 times daily.  -for more immediate relief he can use bisacodyl 10-15mg  1-2 times per week but call if requiring use more frequently so we can make appropriate changes   Colon cancer screening: on hold until ankle surgeries complete per patient request   Gastric/duodenal ulcers: in setting of H.pylori s/p Pylera -continue pantoprazole  40mg  BID for now  -will need to check for H.pylori eradication in the future -consider EGD to verify gastric ulcer healing once he is agreeable, desires postponing until he has completed his ankle surgeries as his pain is debilitating.     Total time spent with patient face to  face, review of records, patient education, documentation for visit equaled 35 minutes.   Sonny RAMAN. Ezzard, MHS, PA-C [facility]

## 2023-12-30 ENCOUNTER — Ambulatory Visit: Payer: Self-pay | Admitting: Gastroenterology

## 2023-12-30 LAB — COMPREHENSIVE METABOLIC PANEL WITH GFR
ALT: 19 IU/L (ref 0–44)
AST: 45 IU/L — ABNORMAL HIGH (ref 0–40)
Albumin: 3.5 g/dL — ABNORMAL LOW (ref 3.9–4.9)
Alkaline Phosphatase: 111 IU/L (ref 47–123)
BUN/Creatinine Ratio: 13 (ref 10–24)
BUN: 18 mg/dL (ref 8–27)
Bilirubin Total: 1.2 mg/dL (ref 0.0–1.2)
CO2: 16 mmol/L — ABNORMAL LOW (ref 20–29)
Calcium: 9.2 mg/dL (ref 8.6–10.2)
Chloride: 104 mmol/L (ref 96–106)
Creatinine, Ser: 1.38 mg/dL — ABNORMAL HIGH (ref 0.76–1.27)
Globulin, Total: 3.5 g/dL (ref 1.5–4.5)
Glucose: 92 mg/dL (ref 70–99)
Potassium: 4.2 mmol/L (ref 3.5–5.2)
Sodium: 135 mmol/L (ref 134–144)
Total Protein: 7 g/dL (ref 6.0–8.5)
eGFR: 57 mL/min/1.73 — ABNORMAL LOW (ref >59)

## 2023-12-30 LAB — CBC WITH DIFFERENTIAL/PLATELET
Basophils Absolute: 0 x10E3/uL (ref 0.0–0.2)
Basos: 0 %
EOS (ABSOLUTE): 0 x10E3/uL (ref 0.0–0.4)
Eos: 1 %
Hematocrit: 23.3 % — ABNORMAL LOW (ref 37.5–51.0)
Hemoglobin: 7.2 g/dL — ABNORMAL LOW (ref 13.0–17.7)
Immature Grans (Abs): 0 x10E3/uL (ref 0.0–0.1)
Immature Granulocytes: 0 %
Lymphocytes Absolute: 1.1 x10E3/uL (ref 0.7–3.1)
Lymphs: 23 %
MCH: 28.2 pg (ref 26.6–33.0)
MCHC: 30.9 g/dL — ABNORMAL LOW (ref 31.5–35.7)
MCV: 91 fL (ref 79–97)
Monocytes Absolute: 0.4 x10E3/uL (ref 0.1–0.9)
Monocytes: 8 %
Neutrophils Absolute: 3.2 x10E3/uL (ref 1.4–7.0)
Neutrophils: 68 %
Platelets: 192 x10E3/uL (ref 150–450)
RBC: 2.55 x10E6/uL — CL (ref 4.14–5.80)
RDW: 13.4 % (ref 11.6–15.4)
WBC: 4.7 x10E3/uL (ref 3.4–10.8)

## 2023-12-30 LAB — PROTIME-INR
INR: 1.2 (ref 0.9–1.2)
Prothrombin Time: 12.6 s — ABNORMAL HIGH (ref 9.1–12.0)

## 2023-12-30 LAB — AFP TUMOR MARKER: AFP, Serum, Tumor Marker: 4.9 ng/mL (ref 0.0–8.4)

## 2024-01-01 ENCOUNTER — Other Ambulatory Visit: Payer: Self-pay

## 2024-01-01 ENCOUNTER — Other Ambulatory Visit: Payer: Self-pay | Admitting: *Deleted

## 2024-01-01 DIAGNOSIS — K7031 Alcoholic cirrhosis of liver with ascites: Secondary | ICD-10-CM

## 2024-01-01 DIAGNOSIS — D649 Anemia, unspecified: Secondary | ICD-10-CM

## 2024-01-01 DIAGNOSIS — R188 Other ascites: Secondary | ICD-10-CM

## 2024-01-01 NOTE — Addendum Note (Signed)
 Addended by: GAYLENE MADELIN CROME on: 01/01/2024 03:48 PM   Modules accepted: Orders

## 2024-01-03 ENCOUNTER — Encounter (HOSPITAL_COMMUNITY): Payer: Self-pay

## 2024-01-03 ENCOUNTER — Ambulatory Visit (HOSPITAL_COMMUNITY): Admission: RE | Admit: 2024-01-03 | Source: Ambulatory Visit

## 2024-01-04 LAB — CBC WITH DIFFERENTIAL/PLATELET
Basophils Absolute: 0 x10E3/uL (ref 0.0–0.2)
Basos: 0 %
EOS (ABSOLUTE): 0.1 x10E3/uL (ref 0.0–0.4)
Eos: 2 %
Hematocrit: 22.5 % — ABNORMAL LOW (ref 37.5–51.0)
Hemoglobin: 7 g/dL — CL (ref 13.0–17.7)
Immature Grans (Abs): 0 x10E3/uL (ref 0.0–0.1)
Immature Granulocytes: 0 %
Lymphocytes Absolute: 0.9 x10E3/uL (ref 0.7–3.1)
Lymphs: 22 %
MCH: 28.1 pg (ref 26.6–33.0)
MCHC: 31.1 g/dL — ABNORMAL LOW (ref 31.5–35.7)
MCV: 90 fL (ref 79–97)
Monocytes Absolute: 0.3 x10E3/uL (ref 0.1–0.9)
Monocytes: 9 %
Neutrophils Absolute: 2.6 x10E3/uL (ref 1.4–7.0)
Neutrophils: 66 %
Platelets: 166 x10E3/uL (ref 150–450)
RBC: 2.49 x10E6/uL — CL (ref 4.14–5.80)
RDW: 13.5 % (ref 11.6–15.4)
WBC: 3.8 x10E3/uL (ref 3.4–10.8)

## 2024-01-04 LAB — IRON,TIBC AND FERRITIN PANEL
Ferritin: 30 ng/mL (ref 30–400)
Iron Saturation: 16 % (ref 15–55)
Iron: 63 ug/dL (ref 38–169)
Total Iron Binding Capacity: 400 ug/dL (ref 250–450)
UIBC: 337 ug/dL (ref 111–343)

## 2024-01-04 LAB — BASIC METABOLIC PANEL WITH GFR
BUN/Creatinine Ratio: 13 (ref 10–24)
BUN: 16 mg/dL (ref 8–27)
CO2: 16 mmol/L — ABNORMAL LOW (ref 20–29)
Calcium: 8.5 mg/dL — ABNORMAL LOW (ref 8.6–10.2)
Chloride: 106 mmol/L (ref 96–106)
Creatinine, Ser: 1.26 mg/dL (ref 0.76–1.27)
Glucose: 95 mg/dL (ref 70–99)
Potassium: 3.8 mmol/L (ref 3.5–5.2)
Sodium: 137 mmol/L (ref 134–144)
eGFR: 63 mL/min/1.73 (ref >59)

## 2024-01-07 ENCOUNTER — Ambulatory Visit: Payer: Self-pay

## 2024-01-09 ENCOUNTER — Ambulatory Visit (HOSPITAL_COMMUNITY)
Admission: RE | Admit: 2024-01-09 | Discharge: 2024-01-09 | Disposition: A | Discharge status: Home / Self Care | Source: Ambulatory Visit | Attending: Gastroenterology | Admitting: Gastroenterology

## 2024-01-09 DIAGNOSIS — K7031 Alcoholic cirrhosis of liver with ascites: Secondary | ICD-10-CM | POA: Diagnosis present

## 2024-01-15 ENCOUNTER — Ambulatory Visit: Payer: Self-pay | Admitting: Gastroenterology

## 2024-01-15 DIAGNOSIS — D649 Anemia, unspecified: Secondary | ICD-10-CM

## 2024-01-18 ENCOUNTER — Ambulatory Visit: Payer: Self-pay | Admitting: Gastroenterology

## 2024-01-18 LAB — CBC WITH DIFFERENTIAL/PLATELET
Basophils Absolute: 0 x10E3/uL (ref 0.0–0.2)
Basos: 0 %
EOS (ABSOLUTE): 0 x10E3/uL (ref 0.0–0.4)
Eos: 1 %
Hematocrit: 25.6 % — ABNORMAL LOW (ref 37.5–51.0)
Hemoglobin: 8.2 g/dL — ABNORMAL LOW (ref 13.0–17.7)
Immature Grans (Abs): 0 x10E3/uL (ref 0.0–0.1)
Immature Granulocytes: 0 %
Lymphocytes Absolute: 0.9 x10E3/uL (ref 0.7–3.1)
Lymphs: 23 %
MCH: 29.1 pg (ref 26.6–33.0)
MCHC: 32 g/dL (ref 31.5–35.7)
MCV: 91 fL (ref 79–97)
Monocytes Absolute: 0.4 x10E3/uL (ref 0.1–0.9)
Monocytes: 9 %
Neutrophils Absolute: 2.6 x10E3/uL (ref 1.4–7.0)
Neutrophils: 67 %
Platelets: 137 x10E3/uL — ABNORMAL LOW (ref 150–450)
RBC: 2.82 x10E6/uL — ABNORMAL LOW (ref 4.14–5.80)
RDW: 15.1 % (ref 11.6–15.4)
WBC: 4 x10E3/uL (ref 3.4–10.8)

## 2024-01-22 ENCOUNTER — Encounter: Payer: Self-pay | Admitting: *Deleted

## 2024-01-23 ENCOUNTER — Encounter: Payer: Self-pay | Admitting: *Deleted

## 2024-01-23 ENCOUNTER — Other Ambulatory Visit: Payer: Self-pay

## 2024-01-23 ENCOUNTER — Other Ambulatory Visit: Payer: Self-pay | Admitting: *Deleted

## 2024-01-23 DIAGNOSIS — D649 Anemia, unspecified: Secondary | ICD-10-CM

## 2024-01-23 MED ORDER — PEG 3350-KCL-NA BICARB-NACL 420 G PO SOLR: 4000.0000 mL | Freq: Once | ORAL | 0 refills | Status: AC

## 2024-01-27 ENCOUNTER — Other Ambulatory Visit: Payer: Self-pay | Admitting: Gastroenterology

## 2024-01-29 ENCOUNTER — Other Ambulatory Visit: Payer: Self-pay

## 2024-01-29 DIAGNOSIS — F5101 Primary insomnia: Secondary | ICD-10-CM

## 2024-01-29 MED ORDER — ZOLPIDEM TARTRATE 5 MG PO TABS: 5.0000 mg | ORAL_TABLET | Freq: Every evening | ORAL | 1 refills | Status: AC | PRN

## 2024-02-05 NOTE — Telephone Encounter (Signed)
 Spoke with pt. He stated he understood it needs to be done but he just knows what he was told. He stated let him think about it and he will call and let us  know

## 2024-02-07 ENCOUNTER — Ambulatory Visit: Payer: Self-pay | Admitting: Gastroenterology

## 2024-02-07 ENCOUNTER — Other Ambulatory Visit: Payer: Self-pay | Admitting: *Deleted

## 2024-02-07 DIAGNOSIS — D649 Anemia, unspecified: Secondary | ICD-10-CM

## 2024-02-07 LAB — CBC WITH DIFFERENTIAL/PLATELET
Basophils Absolute: 0 x10E3/uL (ref 0.0–0.2)
Basos: 1 %
EOS (ABSOLUTE): 0.1 x10E3/uL (ref 0.0–0.4)
Eos: 2 %
Hematocrit: 25 % — ABNORMAL LOW (ref 37.5–51.0)
Hemoglobin: 7.8 g/dL — ABNORMAL LOW (ref 13.0–17.7)
Immature Grans (Abs): 0 x10E3/uL (ref 0.0–0.1)
Immature Granulocytes: 0 %
Lymphocytes Absolute: 1.1 x10E3/uL (ref 0.7–3.1)
Lymphs: 27 %
MCH: 29.4 pg (ref 26.6–33.0)
MCHC: 31.2 g/dL — ABNORMAL LOW (ref 31.5–35.7)
MCV: 94 fL (ref 79–97)
Monocytes Absolute: 0.5 x10E3/uL (ref 0.1–0.9)
Monocytes: 12 %
Neutrophils Absolute: 2.3 x10E3/uL (ref 1.4–7.0)
Neutrophils: 57 %
Platelets: 140 x10E3/uL — ABNORMAL LOW (ref 150–450)
RBC: 2.65 x10E6/uL — CL (ref 4.14–5.80)
RDW: 16.8 % — ABNORMAL HIGH (ref 11.6–15.4)
WBC: 4 x10E3/uL (ref 3.4–10.8)

## 2024-02-12 ENCOUNTER — Encounter (HOSPITAL_COMMUNITY)
Admission: RE | Admit: 2024-02-12 | Discharge: 2024-02-12 | Disposition: A | Discharge status: Home / Self Care | Payer: PRIVATE HEALTH INSURANCE | Source: Ambulatory Visit | Attending: Internal Medicine | Admitting: Internal Medicine

## 2024-02-12 ENCOUNTER — Encounter (HOSPITAL_COMMUNITY): Payer: Self-pay

## 2024-02-12 ENCOUNTER — Other Ambulatory Visit: Payer: Self-pay

## 2024-02-12 NOTE — Pre-Procedure Instructions (Signed)
 Attempted pre-op phone call. Left VM for him to call us back.

## 2024-02-14 ENCOUNTER — Ambulatory Visit (HOSPITAL_COMMUNITY)

## 2024-02-14 ENCOUNTER — Encounter (HOSPITAL_COMMUNITY): Payer: Self-pay | Admitting: Internal Medicine

## 2024-02-14 ENCOUNTER — Other Ambulatory Visit: Payer: Self-pay

## 2024-02-14 ENCOUNTER — Ambulatory Visit (HOSPITAL_COMMUNITY)
Admission: RE | Admit: 2024-02-14 | Discharge: 2024-02-14 | Disposition: A | Discharge status: Home / Self Care | Payer: PRIVATE HEALTH INSURANCE | Source: Home / Self Care | Attending: Internal Medicine | Admitting: Internal Medicine

## 2024-02-14 ENCOUNTER — Encounter (HOSPITAL_COMMUNITY)
Admission: RE | Disposition: A | Discharge status: Home / Self Care | Payer: Self-pay | Source: Home / Self Care | Attending: Internal Medicine

## 2024-02-14 DIAGNOSIS — K2950 Unspecified chronic gastritis without bleeding: Secondary | ICD-10-CM | POA: Diagnosis present

## 2024-02-14 DIAGNOSIS — I8500 Esophageal varices without bleeding: Secondary | ICD-10-CM | POA: Diagnosis not present

## 2024-02-14 DIAGNOSIS — I8510 Secondary esophageal varices without bleeding: Secondary | ICD-10-CM | POA: Insufficient documentation

## 2024-02-14 DIAGNOSIS — Z79899 Other long term (current) drug therapy: Secondary | ICD-10-CM | POA: Diagnosis not present

## 2024-02-14 DIAGNOSIS — K7030 Alcoholic cirrhosis of liver without ascites: Secondary | ICD-10-CM | POA: Insufficient documentation

## 2024-02-14 DIAGNOSIS — K3189 Other diseases of stomach and duodenum: Secondary | ICD-10-CM | POA: Insufficient documentation

## 2024-02-14 DIAGNOSIS — K766 Portal hypertension: Secondary | ICD-10-CM

## 2024-02-14 DIAGNOSIS — Z8711 Personal history of peptic ulcer disease: Secondary | ICD-10-CM | POA: Diagnosis not present

## 2024-02-14 DIAGNOSIS — Z87891 Personal history of nicotine dependence: Secondary | ICD-10-CM | POA: Diagnosis not present

## 2024-02-14 HISTORY — PX: ESOPHAGOGASTRODUODENOSCOPY: SHX5428

## 2024-02-14 SURGERY — EGD (ESOPHAGOGASTRODUODENOSCOPY)
Anesthesia: General

## 2024-02-14 MED ORDER — LIDOCAINE 2% (20 MG/ML) 5 ML SYRINGE
INTRAMUSCULAR | Status: DC | PRN
  Administered 2024-02-14: 100 mg via INTRAVENOUS

## 2024-02-14 MED ORDER — DEXMEDETOMIDINE HCL IN NACL 80 MCG/20ML IV SOLN
INTRAVENOUS | Status: DC | PRN
  Administered 2024-02-14: 6 ug via INTRAVENOUS

## 2024-02-14 MED ORDER — PROPOFOL 500 MG/50ML IV EMUL
INTRAVENOUS | Status: DC | PRN
  Administered 2024-02-14: 40 mg via INTRAVENOUS
  Administered 2024-02-14: 100 mg via INTRAVENOUS
  Administered 2024-02-14: 125 ug/kg/min via INTRAVENOUS
  Administered 2024-02-14: 30 mg via INTRAVENOUS

## 2024-02-14 MED ORDER — PHENYLEPHRINE 80 MCG/ML (10ML) SYRINGE FOR IV PUSH (FOR BLOOD PRESSURE SUPPORT)
PREFILLED_SYRINGE | INTRAVENOUS | Status: DC | PRN
  Administered 2024-02-14: 80 ug via INTRAVENOUS
  Administered 2024-02-14: 160 ug via INTRAVENOUS

## 2024-02-14 MED ORDER — LACTATED RINGERS IV SOLN: INTRAVENOUS | Status: DC

## 2024-02-14 NOTE — OR Nursing (Signed)
 Assisted to bathroom

## 2024-02-14 NOTE — Discharge Instructions (Addendum)
 EGD Discharge instructions Please read the instructions outlined below and refer to this sheet in the next few weeks. These discharge instructions provide you with general information on caring for yourself after you leave the hospital. Your doctor may also give you specific instructions. While your treatment has been planned according to the most current medical practices available, unavoidable complications occasionally occur. If you have any problems or questions after discharge, please call your doctor. ACTIVITY You may resume your regular activity but move at a slower pace for the next 24 hours.  Take frequent rest periods for the next 24 hours.  Walking will help expel (get rid of) the air and reduce the bloated feeling in your abdomen.  No driving for 24 hours (because of the anesthesia (medicine) used during the test).  You may shower.  Do not sign any important legal documents or operate any machinery for 24 hours (because of the anesthesia used during the test).  NUTRITION Drink plenty of fluids.  You may resume your normal diet.  Begin with a light meal and progress to your normal diet.  Avoid alcoholic beverages for 24 hours or as instructed by your caregiver.  MEDICATIONS You may resume your normal medications unless your caregiver tells you otherwise.  WHAT YOU CAN EXPECT TODAY You may experience abdominal discomfort such as a feeling of fullness or gas pains.  FOLLOW-UP Your doctor will discuss the results of your test with you.  SEEK IMMEDIATE MEDICAL ATTENTION IF ANY OF THE FOLLOWING OCCUR: Excessive nausea (feeling sick to your stomach) and/or vomiting.  Severe abdominal pain and distention (swelling).  Trouble swallowing.  Temperature over 101 F (37.8 C).  Rectal bleeding or vomiting of blood.     Ulcers completely healed.  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  Take every day would like to see you have 3  semisoft bowel movements daily   biopsies taken today.  Further recommendations to follow in the near future  Follow-up appointment with Sonny Kerns in the office in approximately 4 weeks   at patient request I called Niels Mon  at [phone] findings and recommendations

## 2024-02-14 NOTE — Anesthesia Postprocedure Evaluation (Signed)
"   Anesthesia Post Note  Patient: Billy Chung  Procedure(s) Performed: EGD (ESOPHAGOGASTRODUODENOSCOPY)  Patient location during evaluation: PACU Anesthesia Type: General Level of consciousness: awake and alert Pain management: pain level controlled Vital Signs Assessment: post-procedure vital signs reviewed and stable Respiratory status: spontaneous breathing, nonlabored ventilation, respiratory function stable and patient connected to nasal cannula oxygen Cardiovascular status: blood pressure returned to baseline and stable Postop Assessment: no apparent nausea or vomiting Anesthetic complications: no   No notable events documented.   Last Vitals:  Vitals:   02/14/24 0716 02/14/24 0940  BP: 134/61 (!) 111/52  Pulse: 62 66  Resp: 15 16  Temp: 36.5 C 36.6 C  SpO2: 100% 100%    Last Pain:  Vitals:   02/14/24 0940  TempSrc: Oral  PainSc: 0-No pain                 Andrea Limes      "

## 2024-02-14 NOTE — Anesthesia Procedure Notes (Signed)
 Date/Time: 02/14/2024 9:19 AM  Performed by: Para Jerelene CROME, CRNAOxygen Delivery Method: Nasal cannula Comments: OptiFlow Nasal Cannula.

## 2024-02-14 NOTE — Anesthesia Preprocedure Evaluation (Addendum)
"                                    Anesthesia Evaluation  Patient identified by MRN, date of birth, ID band Patient awake    Reviewed: Allergy & Precautions, H&P , NPO status , Patient's Chart, lab work & pertinent test results  Airway Mallampati: III  TM Distance: >3 FB Neck ROM: Full    Dental no notable dental hx. (+) Poor Dentition, Missing   Pulmonary pneumonia, resolved, former smoker   Pulmonary exam normal breath sounds clear to auscultation       Cardiovascular negative cardio ROS Normal cardiovascular exam Rhythm:Regular Rate:Normal     Neuro/Psych  PSYCHIATRIC DISORDERS      negative neurological ROS     GI/Hepatic PUD,GERD  ,,(+) Cirrhosis     substance abuse  alcohol use  Endo/Other  negative endocrine ROS    Renal/GU negative Renal ROS  negative genitourinary   Musculoskeletal  (+) Arthritis , Osteoarthritis,    Abdominal   Peds negative pediatric ROS (+)  Hematology negative hematology ROS (+)   Anesthesia Other Findings   Reproductive/Obstetrics negative OB ROS                              Anesthesia Physical Anesthesia Plan  ASA: 3  Anesthesia Plan: General   Post-op Pain Management:    Induction: Intravenous  PONV Risk Score and Plan:   Airway Management Planned: Nasal Cannula and Natural Airway  Additional Equipment:   Intra-op Plan:   Post-operative Plan:   Informed Consent: I have reviewed the patients History and Physical, chart, labs and discussed the procedure including the risks, benefits and alternatives for the proposed anesthesia with the patient or authorized representative who has indicated his/her understanding and acceptance.     Dental advisory given  Plan Discussed with: CRNA  Anesthesia Plan Comments:          Anesthesia Quick Evaluation  "

## 2024-02-14 NOTE — Anesthesia Postprocedure Evaluation (Signed)
"   Anesthesia Post Note  Patient: Sheena VEAR Pouch  Procedure(s) Performed: EGD (ESOPHAGOGASTRODUODENOSCOPY)  Patient location during evaluation: PACU Anesthesia Type: General Level of consciousness: awake and alert Pain management: pain level controlled Vital Signs Assessment: post-procedure vital signs reviewed and stable Respiratory status: spontaneous breathing, nonlabored ventilation, respiratory function stable and patient connected to nasal cannula oxygen Cardiovascular status: blood pressure returned to baseline and stable Postop Assessment: no apparent nausea or vomiting Anesthetic complications: no   No notable events documented.   Last Vitals:  Vitals:   02/14/24 0716 02/14/24 0940  BP: 134/61 (!) 111/52  Pulse: 62 66  Resp: 15 16  Temp: 36.5 C 36.6 C  SpO2: 100% 100%    Last Pain:  Vitals:   02/14/24 0940  TempSrc: Oral  PainSc: 0-No pain                 Andrea Limes      "

## 2024-02-14 NOTE — H&P (Signed)
 @LOGO @   Gastroenterology Progress Note    Primary Care Physician:  Bevely Doffing, FNP Primary Gastroenterologist:  Dr. Shaaron  Pre-Procedure History & Physical: HPI:  Billy Chung is a 63 y.o. 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  history of decompensated EtOH cirrhosis with portal hypertension but no esophageal varices on prior EGD.  History of anasarca decompensation alcohol withdrawal prior EGD demonstrated no esophageal varices multiple gastric and duodenal ulcers.  Treated for H. pylori.  Eradication not proven as of yet.  Clinically he has done well.  He is here for surveillance EGD to reassess for H. pylori.  This was delayed because of his orthopedic procedure.  Past Medical History:  Diagnosis Date   Alcohol abuse    Arthritis    plate and screws in right ankle   Cirrhosis (HCC)    Pneumonia     Past Surgical History:  Procedure Laterality Date   APPENDECTOMY     BIOPSY  04/05/2023   Procedure: BIOPSY;  Surgeon: Shaaron Lamar HERO, MD;  Location: [facility];  Service: Endoscopy;;   ESOPHAGOGASTRODUODENOSCOPY (EGD) WITH PROPOFOL  N/A 04/05/2023   Procedure: ESOPHAGOGASTRODUODENOSCOPY (EGD) WITH PROPOFOL ;  Surgeon: Shaaron Lamar HERO, MD;  Location: [facility];  Service: Endoscopy;  Laterality: N/A;  2:30 pm, asa 3, pt knows to arrive at 7:00   ORIF ANKLE FRACTURE Right 11/04/2015   Procedure: OPEN REDUCTION INTERNAL FIXATION (ORIF) ANKLE FRACTURE;  Surgeon: Jerona LULLA Sage, MD;  Location: [facility] OR;  Service: Orthopedics;  Laterality: Right;    Prior to Admission medications  Medication Sig Start Date End Date Taking? Authorizing Provider  furosemide  (LASIX ) 40 MG tablet TAKE 1 TABLET(40 MG) BY MOUTH DAILY 01/29/24  Yes Ezzard Sonny RAMAN, PA-C  HYDROcodone -acetaminophen  (NORCO/VICODIN) 5-325 MG tablet One tablet every four hours for pain. 05/24/23  Yes Brenna Lin, MD  lactulose  (CHRONULAC ) 10 GM/15ML solution Take 30 mLs (20 g total) by mouth 3  (three) times daily as needed for mild constipation. 10/24/23  Yes Ezzard Sonny RAMAN, PA-C  pantoprazole  (PROTONIX ) 40 MG tablet Take 1 tablet (40 mg total) by mouth 2 (two) times daily before a meal. 05/02/23  Yes Ezzard Sonny RAMAN, PA-C  polyethylene glycol powder (GLYCOLAX/MIRALAX) 17 GM/SCOOP powder Take 1 Container by mouth daily as needed.   Yes [provider]  spironolactone  (ALDACTONE ) 100 MG tablet Take 1 tablet (100 mg total) by mouth daily. 01/29/24  Yes Ezzard Sonny RAMAN, PA-C  zolpidem  (AMBIEN ) 5 MG tablet Take 1 tablet (5 mg total) by mouth at bedtime as needed for sleep. 01/29/24  Yes Bevely Doffing, FNP    Allergies as of 01/22/2024 - Review Complete 12/25/2023  Allergen Reaction Noted   No known allergies  11/04/2015    Family History  Problem Relation Age of Onset   Cirrhosis Maternal Grandmother        no etoh   Cirrhosis Maternal Aunt        no etoh   Colon cancer Neg Hx     Social History   Socioeconomic History   Marital status: Single    Spouse name: Not on file   Number of children: Not on file   Years of education: Not on file   Highest education level: Not on file  Occupational History   Not on file  Tobacco Use   Smoking status: Former   Smokeless tobacco: Former    Types: Engineer, Drilling   Vaping status: Never  Used  Substance and Sexual Activity   Alcohol use: Not Currently    Alcohol/week: 168.0 standard drinks of alcohol    Types: 168 Cans of beer per week    Comment: daily (stopped 11/24/22)   Drug use: Not Currently    Types: Cocaine    Comment: former drug use (2 years ago)   Sexual activity: Yes  Other Topics Concern   Not on file  Social History Narrative   Not on file   Social Drivers of Health   Tobacco Use: Medium Risk (02/14/2024)   Patient History    Smoking Tobacco Use: Former    Smokeless Tobacco Use: Former    Passive Exposure: Not on Stage Manager: Not on Ship Broker Insecurity: Not on file   Transportation Needs: Not on file  Physical Activity: Not on file  Stress: Not on file  Social Connections: Not on file  Intimate Partner Violence: Not on file  Depression (PHQ2-9): Low Risk (08/22/2023)   Depression (PHQ2-9)    PHQ-2 Score: 0  Alcohol Screen: Not on file  Housing: Not on file  Utilities: Not on file  Health Literacy: Not on file    Review of Systems   See HPI, otherwise negative ROS  Physical Exam: BP 134/61   Pulse 62   Temp 97.7 F (36.5 C) (Oral)   Resp 15   Ht 5' 9 (1.753 m)   Wt 92.1 kg   SpO2 100%   BMI 29.98 kg/m  General:   Alert,  Well-developed, well-nourished, pleasant and cooperative in NAD Neck:  Supple; no masses or thyromegaly. No significant cervical adenopathy. Lungs:  Clear throughout to auscultation.   No wheezes, crackles, or rhonchi. No acute distress. Heart:  Regular rate and rhythm; no murmurs, clicks, rubs,  or gallops. Abdomen: Non-distended, normal bowel sounds.  Soft and nontender without appreciable mass or hepatosplenomegaly.    Impression/Plan:     64 year old gentleman with a EtOH use related cirrhosis and portal hypertension found to have multiple gastroduodenal ulcers earlier in the year H. pylori positive.  He was treated.  He is here for reassessment of of his upper GI tract.  The risks, benefits, limitations, alternatives and imponderables have been reviewed with the patient. Potential for esophageal dilation, biopsy, etc. have also been reviewed.  Questions have been answered. All parties agreeable.     Notice: This dictation was prepared with Dragon dictation along with smaller phrase technology. Any transcriptional errors that result from this process are unintentional and may not be corrected upon review.

## 2024-02-14 NOTE — Transfer of Care (Addendum)
 Immediate Anesthesia Transfer of Care Note  Patient: Billy Chung  Procedure(s) Performed: EGD (ESOPHAGOGASTRODUODENOSCOPY)  Patient Location: Short Stay  Anesthesia Type:MAC  Level of Consciousness: drowsy and patient cooperative  Airway & Oxygen Therapy: Patient Spontanous Breathing and Patient connected to nasal cannula oxygen  Post-op Assessment: Report given to RN and Post -op Vital signs reviewed and stable  Post vital signs: Reviewed and stable  Last Vitals:  Vitals Value Taken Time  BP 111/52 02/14/24 09:40  Temp 36.6 C 02/14/24 09:40  Pulse 66 02/14/24 09:40  Resp 16 02/14/24 09:40  SpO2 100 % 02/14/24 09:40    Last Pain:  Vitals:   02/14/24 0940  TempSrc: Oral  PainSc: 0-No pain         Complications: No notable events documented.

## 2024-02-14 NOTE — Op Note (Signed)
 [facility] Patient Name: Billy Chung Procedure Date: 02/14/2024 8:59 AM MRN: [id number] Date of Birth: Jan 10, 1961 Attending MD: Lamar Ozell Hollingshead , MD, [id number] CSN: [id number] Age: 63 Admit Type: Outpatient Procedure:                Upper GI endoscopy Indications:              Surveillance procedure, Follow-up of Helicobacter                            pylori Providers:                Lamar Ozell Hollingshead, MD, Jon LABOR. Gerome RN, RN,                            Dorcas Lenis, Technician Referring MD:              Medicines:                Propofol  per Anesthesia Complications:            No immediate complications. Estimated Blood Loss:     Estimated blood loss was minimal. Procedure:                Pre-Anesthesia Assessment:                           - Prior to the procedure, a History and Physical                            was performed, and patient medications and                            allergies were reviewed. The patient's tolerance of                            previous anesthesia was also reviewed. The risks                            and benefits of the procedure and the sedation                            options and risks were discussed with the patient.                            All questions were answered, and informed consent                            was obtained. Prior Anticoagulants: The patient has                            taken no anticoagulant or antiplatelet agents. ASA                            Grade Assessment: III - A patient with severe  systemic disease. After reviewing the risks and                            benefits, the patient was deemed in satisfactory                            condition to undergo the procedure.                           After obtaining informed consent, the endoscope was                            passed under direct vision. Throughout the                            procedure, the  patient's blood pressure, pulse, and                            oxygen saturations were monitored continuously. The                            [id number] ([id number] was introduced through the                            mouth, and advanced to the second part of duodenum.                            The upper GI endoscopy was accomplished without                            difficulty. The patient tolerated the procedure                            well. Scope In: 9:26:06 AM Scope Out: 9:30:39 AM Total Procedure Duration: 0 hours 4 minutes 33 seconds  Findings:      (2) columns grade 1 esophageal varices involving the distal 5 cm of the       tubular esophagus. (1) grade 2 varix on the opposite wall from the grade       ones. No bleeding stigmata. Tubular esophagus otherwise appeared normal.      Moderate portal hypertensive gastropathy was found in the entire       examined stomach.      The duodenal bulb and second portion of the duodenum were normal.       Previously noted peptic ulcer disease completely healed. Biopsies of the       gastric mucosa taken to update H. pylori status. Impression:               - Portal hypertensive gastropathy.                           - Normal duodenal bulb and second portion of the                            duodenum.                           -  Previously noted peptic ulcer disease completely                            healed?status post gastric biopsy                           - New diagnosis of esophageal varices. He will need                            to go on a nonselective beta-blocker in the near                            future.                           . Moderate Sedation:      Moderate (conscious) sedation was personally administered by an       anesthesia professional. The following parameters were monitored: oxygen       saturation, heart rate, blood pressure, respiratory rate, EKG, adequacy       of pulmonary ventilation, and response  to care. Recommendation:           - Patient has a contact number available for                            emergencies. The signs and symptoms of potential                            delayed complications were discussed with the                            patient. Return to normal activities tomorrow.                            Written discharge instructions were provided to the                            patient.                           - Advance diet as tolerated. Follow-up on                            pathology. Office visit with us  in 1 month. Also,                            of note, patient takes lactulose  on an as needed                            basis may go days without a bowel movement. I                            reviewed with the wife and patient this is  incorrect. We go back on the original prescription                            of 30 g 3 times a day?every day. Titrated to 2-3                            soft bowel movements daily. Procedure Code(s):        --- Professional ---                           [phone], Esophagogastroduodenoscopy, flexible,                            transoral; diagnostic, including collection of                            specimen(s) by brushing or washing, when performed                            (separate procedure) Diagnosis Code(s):        --- Professional ---                           K76.6, Portal hypertension                           K31.89, Other diseases of stomach and duodenum                           B96.81, Helicobacter pylori [H. pylori] as the                            cause of diseases classified elsewhere CPT copyright 2022 [employer]. All rights reserved. The codes documented in this report are preliminary and upon coder review may  be revised to meet current compliance requirements. Lamar HERO. Braley Luckenbaugh, MD Lamar Ozell Hollingshead, MD 02/14/2024 10:22:39 AM This report has been signed  electronically. Number of Addenda: 0

## 2024-02-19 ENCOUNTER — Encounter (HOSPITAL_COMMUNITY): Payer: Self-pay | Admitting: Internal Medicine

## 2024-02-19 LAB — SURGICAL PATHOLOGY

## 2024-02-20 ENCOUNTER — Ambulatory Visit: Payer: Self-pay | Admitting: Internal Medicine
